# Patient Record
Sex: Female | Born: 1980 | Race: Black or African American | Hispanic: No | Marital: Married | Smoking: Never smoker
Health system: Southern US, Community
[De-identification: ages and names within clinical notes are randomized; demographics above are authoritative.]

## PROBLEM LIST (undated history)

## (undated) DIAGNOSIS — K219 Gastro-esophageal reflux disease without esophagitis: Secondary | ICD-10-CM

## (undated) DIAGNOSIS — D649 Anemia, unspecified: Secondary | ICD-10-CM

---

## 1999-11-13 ENCOUNTER — Emergency Department (HOSPITAL_COMMUNITY): Admission: EM | Admit: 1999-11-13 | Discharge: 1999-11-13 | Payer: Self-pay | Admitting: Emergency Medicine

## 2002-06-27 ENCOUNTER — Encounter: Admission: RE | Admit: 2002-06-27 | Discharge: 2002-06-27 | Payer: Self-pay | Admitting: *Deleted

## 2002-06-27 ENCOUNTER — Other Ambulatory Visit: Admission: RE | Admit: 2002-06-27 | Discharge: 2002-06-27 | Payer: Self-pay | Admitting: *Deleted

## 2002-07-11 ENCOUNTER — Encounter: Admission: RE | Admit: 2002-07-11 | Discharge: 2002-07-11 | Payer: Self-pay | Admitting: *Deleted

## 2006-09-14 ENCOUNTER — Emergency Department (HOSPITAL_COMMUNITY): Admission: EM | Admit: 2006-09-14 | Discharge: 2006-09-14 | Payer: Self-pay | Admitting: Emergency Medicine

## 2006-10-17 ENCOUNTER — Ambulatory Visit: Payer: Self-pay | Admitting: Family Medicine

## 2007-04-03 ENCOUNTER — Ambulatory Visit (HOSPITAL_COMMUNITY): Admission: RE | Admit: 2007-04-03 | Discharge: 2007-04-03 | Payer: Self-pay | Admitting: Obstetrics & Gynecology

## 2008-02-17 ENCOUNTER — Other Ambulatory Visit: Admission: RE | Admit: 2008-02-17 | Discharge: 2008-02-17 | Payer: Self-pay | Admitting: Obstetrics and Gynecology

## 2009-02-17 ENCOUNTER — Other Ambulatory Visit: Admission: RE | Admit: 2009-02-17 | Discharge: 2009-02-17 | Payer: Self-pay | Admitting: Obstetrics and Gynecology

## 2010-01-11 ENCOUNTER — Encounter: Admission: RE | Admit: 2010-01-11 | Discharge: 2010-01-11 | Payer: Self-pay | Admitting: Obstetrics and Gynecology

## 2010-03-01 ENCOUNTER — Encounter (INDEPENDENT_AMBULATORY_CARE_PROVIDER_SITE_OTHER): Payer: Self-pay | Admitting: Obstetrics and Gynecology

## 2010-03-01 ENCOUNTER — Inpatient Hospital Stay (HOSPITAL_COMMUNITY): Admission: AD | Admit: 2010-03-01 | Discharge: 2010-03-05 | Payer: Self-pay | Admitting: Obstetrics and Gynecology

## 2010-10-02 HISTORY — PX: CHOLECYSTECTOMY: SHX55

## 2010-12-13 ENCOUNTER — Other Ambulatory Visit: Payer: Self-pay | Admitting: Gastroenterology

## 2010-12-13 DIAGNOSIS — R1013 Epigastric pain: Secondary | ICD-10-CM

## 2010-12-15 ENCOUNTER — Ambulatory Visit
Admission: RE | Admit: 2010-12-15 | Discharge: 2010-12-15 | Disposition: A | Discharge status: Home / Self Care | Payer: 59 | Source: Ambulatory Visit | Attending: Gastroenterology | Admitting: Gastroenterology

## 2010-12-15 DIAGNOSIS — R1013 Epigastric pain: Secondary | ICD-10-CM

## 2010-12-16 LAB — HIV ANTIBODY (ROUTINE TESTING W REFLEX): HIV: NONREACTIVE

## 2010-12-16 LAB — ABO/RH

## 2010-12-19 LAB — CBC
HCT: 29.7 % — ABNORMAL LOW (ref 36.0–46.0)
HCT: 35.2 % — ABNORMAL LOW (ref 36.0–46.0)
Hemoglobin: 10.3 g/dL — ABNORMAL LOW (ref 12.0–15.0)
Hemoglobin: 12.2 g/dL (ref 12.0–15.0)
MCHC: 34.8 g/dL (ref 30.0–36.0)
MCHC: 34.6 g/dL (ref 30.0–36.0)
Platelets: 163 10*3/uL (ref 150–400)
RBC: 3.4 MIL/uL — ABNORMAL LOW (ref 3.87–5.11)
RDW: 13.5 % (ref 11.5–15.5)
RDW: 13.5 % (ref 11.5–15.5)
WBC: 14.2 10*3/uL — ABNORMAL HIGH (ref 4.0–10.5)

## 2010-12-27 ENCOUNTER — Other Ambulatory Visit: Payer: Self-pay | Admitting: Obstetrics and Gynecology

## 2010-12-27 ENCOUNTER — Other Ambulatory Visit (HOSPITAL_COMMUNITY)
Admission: RE | Admit: 2010-12-27 | Discharge: 2010-12-27 | Disposition: A | Discharge status: Home / Self Care | Payer: 59 | Source: Ambulatory Visit | Attending: Obstetrics and Gynecology | Admitting: Obstetrics and Gynecology

## 2010-12-27 DIAGNOSIS — Z01419 Encounter for gynecological examination (general) (routine) without abnormal findings: Secondary | ICD-10-CM | POA: Insufficient documentation

## 2011-01-26 ENCOUNTER — Encounter (HOSPITAL_COMMUNITY): Payer: 59

## 2011-01-26 ENCOUNTER — Other Ambulatory Visit: Payer: Self-pay | Admitting: Surgery

## 2011-01-26 LAB — CBC
Hemoglobin: 11.6 g/dL — ABNORMAL LOW (ref 12.0–15.0)
MCH: 29.2 pg (ref 26.0–34.0)
MCV: 86.1 fL (ref 78.0–100.0)
Platelets: 249 10*3/uL (ref 150–400)
RBC: 3.97 MIL/uL (ref 3.87–5.11)
RDW: 12.9 % (ref 11.5–15.5)
WBC: 8.5 10*3/uL (ref 4.0–10.5)

## 2011-01-26 LAB — SURGICAL PCR SCREEN: Staphylococcus aureus: NEGATIVE

## 2011-02-01 ENCOUNTER — Ambulatory Visit (HOSPITAL_COMMUNITY)
Admission: RE | Admit: 2011-02-01 | Discharge: 2011-02-01 | Disposition: A | Discharge status: Home / Self Care | Payer: 59 | Source: Ambulatory Visit | Attending: Surgery | Admitting: Surgery

## 2011-02-01 ENCOUNTER — Other Ambulatory Visit: Payer: Self-pay | Admitting: Surgery

## 2011-02-01 DIAGNOSIS — Z79899 Other long term (current) drug therapy: Secondary | ICD-10-CM | POA: Insufficient documentation

## 2011-02-01 DIAGNOSIS — K8010 Calculus of gallbladder with chronic cholecystitis without obstruction: Secondary | ICD-10-CM | POA: Insufficient documentation

## 2011-02-01 DIAGNOSIS — O9989 Other specified diseases and conditions complicating pregnancy, childbirth and the puerperium: Secondary | ICD-10-CM | POA: Insufficient documentation

## 2011-02-01 DIAGNOSIS — Z01812 Encounter for preprocedural laboratory examination: Secondary | ICD-10-CM | POA: Insufficient documentation

## 2011-02-01 DIAGNOSIS — R1013 Epigastric pain: Secondary | ICD-10-CM | POA: Insufficient documentation

## 2011-02-01 DIAGNOSIS — R110 Nausea: Secondary | ICD-10-CM | POA: Insufficient documentation

## 2011-02-01 DIAGNOSIS — Z01818 Encounter for other preprocedural examination: Secondary | ICD-10-CM | POA: Insufficient documentation

## 2011-02-01 DIAGNOSIS — R1011 Right upper quadrant pain: Secondary | ICD-10-CM | POA: Insufficient documentation

## 2011-02-01 NOTE — Op Note (Signed)
Mary Morton, LAATSCH            ACCOUNT NO.:  [id number]  MEDICAL RECORD NO.:  [id number]           PATIENT TYPE:  O  LOCATION:  DAYL                         FACILITY:  [facility]  PHYSICIAN:  Abigail Miyamoto, M.D. DATE OF BIRTH:  [birth year]  DATE OF PROCEDURE:  02/01/2011 DATE OF DISCHARGE:                              OPERATIVE REPORT   PREOPERATIVE DIAGNOSIS:  Symptomatic cholelithiasis.  POSTOPERATIVE DIAGNOSIS:  Symptomatic cholelithiasis.  PROCEDURE:  Laparoscopic cholecystectomy.  SURGEON:  Abigail Miyamoto, M.D.  ANESTHESIA:  General and 0.5% Marcaine.  ESTIMATED BLOOD LOSS:  Minimal.  INDICATIONS:  The is a 30 year old female who is in her second trimester pregnancy.  She has had significant attacks of symptomatic cholelithiasis both before and during her pregnancy.  Decision was made to proceed with cholecystectomy.  FINDINGS:  The patient was found to have a very thick-walled gallbladder with large amount of adhesions to the gallbladder consistent with chronic cholecystitis.  PROCEDURE IN DETAIL:  The patient was brought to the operative room, identified as [employer]  She was placed on the operating room table and general anesthesia was induced.  Her abdomen was then prepped and draped in usual sterile fashion.  Using a #15 blade, a small vertical incision was made above the umbilicus.  This was carried down to the fascia which was then opened with scalpel.  A hemostat was then used to pass through the peritoneal cavity under direct vision.  Next, 0 Vicryl suture pursestring suture was placed around the opening.  The [facility] port was placed through the opening and insufflation of the abdomen was begun.  I could easily visualize the gallbladder which was thick-walled in appearance.  I was able to also visualize the gravid uterus and the pelvis.  I placed a 5-mm port in the patient's epigastrium and 2 more in the right upper quadrant all under  direct vision.  The gallbladder was then grasped and retracted above the liver bed.  A large amount of adhesions of omentum to the gallbladder were then taken down both bluntly and with the electrocautery.  I was then able to identify the base of the gallbladder.  The cystic duct and cystic artery were dissected out and a critical window was achieved around both.  I clipped the cystic duct 3 times proximally, once distally and transected it.  I then clipped the cystic artery 3 times proximally, once distally and transected it as well.  I then slowly dissected free the gallbladder from the liver bed with the cautery. Once it was free from liver bed, hemostasis was achieved with cautery. I then placed the gallbladder in an endosac and removed it through the incision at the umbilicus.  I then again evaluated the abdomen, again hemostasis appeared to be achieved.  The 0 Vicryl at the umbilicus was tied in place closing the fascial defect.  All ports were removed under direct vision and the abdomen was deflated.  All incisions were anesthetized with Marcaine and closed with 4-0 Monocryl subcuticular sutures.  Steri- Strips and Band-Aids were then applied.  The patient tolerated the procedure well.  All counts were correct at the end of  procedure.  The patient was then extubated in the operating room and taken in stable condition to recovery room.     Abigail Miyamoto, M.D.     DB/MEDQ  D:  02/01/2011  T:  02/01/2011  Job:  [id number]  Electronically Signed by Abigail Miyamoto M.D. on 02/01/2011 01:56:35 PM

## 2011-02-22 ENCOUNTER — Other Ambulatory Visit (HOSPITAL_COMMUNITY): Payer: Self-pay | Admitting: Obstetrics and Gynecology

## 2011-03-01 ENCOUNTER — Ambulatory Visit (HOSPITAL_COMMUNITY)
Admission: RE | Admit: 2011-03-01 | Discharge: 2011-03-01 | Disposition: A | Discharge status: Home / Self Care | Payer: 59 | Source: Ambulatory Visit | Attending: Obstetrics and Gynecology | Admitting: Obstetrics and Gynecology

## 2011-03-01 ENCOUNTER — Other Ambulatory Visit (HOSPITAL_COMMUNITY): Payer: Self-pay | Admitting: Obstetrics and Gynecology

## 2011-03-01 DIAGNOSIS — O34219 Maternal care for unspecified type scar from previous cesarean delivery: Secondary | ICD-10-CM

## 2011-03-01 DIAGNOSIS — IMO0001 Reserved for inherently not codable concepts without codable children: Secondary | ICD-10-CM | POA: Insufficient documentation

## 2011-03-01 DIAGNOSIS — Z363 Encounter for antenatal screening for malformations: Secondary | ICD-10-CM | POA: Insufficient documentation

## 2011-03-01 DIAGNOSIS — O358XX0 Maternal care for other (suspected) fetal abnormality and damage, not applicable or unspecified: Secondary | ICD-10-CM | POA: Insufficient documentation

## 2011-03-01 DIAGNOSIS — Z1389 Encounter for screening for other disorder: Secondary | ICD-10-CM | POA: Insufficient documentation

## 2011-04-12 ENCOUNTER — Encounter (HOSPITAL_COMMUNITY): Payer: Self-pay

## 2011-04-12 ENCOUNTER — Other Ambulatory Visit (HOSPITAL_COMMUNITY): Payer: Self-pay | Admitting: Obstetrics and Gynecology

## 2011-04-12 ENCOUNTER — Ambulatory Visit (HOSPITAL_COMMUNITY)
Admission: RE | Admit: 2011-04-12 | Discharge: 2011-04-12 | Disposition: A | Discharge status: Home / Self Care | Payer: 59 | Source: Ambulatory Visit | Attending: Obstetrics and Gynecology | Admitting: Obstetrics and Gynecology

## 2011-04-12 DIAGNOSIS — Q270 Congenital absence and hypoplasia of umbilical artery: Secondary | ICD-10-CM

## 2011-04-12 DIAGNOSIS — IMO0001 Reserved for inherently not codable concepts without codable children: Secondary | ICD-10-CM | POA: Insufficient documentation

## 2011-04-12 DIAGNOSIS — O34219 Maternal care for unspecified type scar from previous cesarean delivery: Secondary | ICD-10-CM | POA: Insufficient documentation

## 2011-04-12 NOTE — Progress Notes (Signed)
Report in AS/EPIC; follow-up as needed 

## 2011-05-24 ENCOUNTER — Ambulatory Visit (HOSPITAL_COMMUNITY)
Admission: RE | Admit: 2011-05-24 | Discharge: 2011-05-24 | Disposition: A | Discharge status: Home / Self Care | Payer: 59 | Source: Ambulatory Visit | Attending: Obstetrics and Gynecology | Admitting: Obstetrics and Gynecology

## 2011-05-24 ENCOUNTER — Other Ambulatory Visit (HOSPITAL_COMMUNITY): Payer: Self-pay | Admitting: Obstetrics and Gynecology

## 2011-05-24 DIAGNOSIS — Q270 Congenital absence and hypoplasia of umbilical artery: Secondary | ICD-10-CM

## 2011-05-24 DIAGNOSIS — IMO0001 Reserved for inherently not codable concepts without codable children: Secondary | ICD-10-CM | POA: Insufficient documentation

## 2011-05-24 DIAGNOSIS — O34219 Maternal care for unspecified type scar from previous cesarean delivery: Secondary | ICD-10-CM | POA: Insufficient documentation

## 2011-05-24 NOTE — Progress Notes (Signed)
Patient seen for ultrasound only appointment today.  Please see AS-OBGYN report for details.  

## 2011-06-21 ENCOUNTER — Ambulatory Visit (HOSPITAL_COMMUNITY)
Admission: RE | Admit: 2011-06-21 | Discharge: 2011-06-21 | Disposition: A | Discharge status: Home / Self Care | Payer: 59 | Source: Ambulatory Visit | Attending: Obstetrics and Gynecology | Admitting: Obstetrics and Gynecology

## 2011-06-21 DIAGNOSIS — IMO0001 Reserved for inherently not codable concepts without codable children: Secondary | ICD-10-CM | POA: Insufficient documentation

## 2011-06-21 DIAGNOSIS — Q270 Congenital absence and hypoplasia of umbilical artery: Secondary | ICD-10-CM

## 2011-06-21 DIAGNOSIS — O34219 Maternal care for unspecified type scar from previous cesarean delivery: Secondary | ICD-10-CM | POA: Insufficient documentation

## 2011-07-03 NOTE — Patient Instructions (Addendum)
   Your procedure is scheduled on: Monday October 8th  Enter through the Main Entrance of [facility] at: 7:30am Pick up the phone at the desk and dial [phone] and inform us of your arrival  Please call this number if you have any problems the morning of surgery: [phone]  Remember: Do not eat food after midnight  Do not drink clear liquids after:midnight Take these medicines the morning of surgery with a SIP OF WATER: pepcid  Do not wear jewelry, make-up, or FINGER nail polish Do not wear lotions, powders, or perfumes.  You may wear deodorant. Do not shave 48 hours prior to surgery. Do not bring valuables to the hospital. Leave suitcase in the car. After Surgery it may be brought to your room. For patients being admitted to the hospital, checkout time is 11:00am the day of discharge.   Remember to use your hibiclens as instructed.Please shower with 1/2 bottle the evening before your surgery and the other 1/2 bottle the morning of surgery.

## 2011-07-05 ENCOUNTER — Encounter (HOSPITAL_COMMUNITY): Payer: Self-pay

## 2011-07-05 ENCOUNTER — Encounter (HOSPITAL_COMMUNITY)
Admission: RE | Admit: 2011-07-05 | Discharge: 2011-07-05 | Disposition: A | Discharge status: Home / Self Care | Payer: 59 | Source: Ambulatory Visit | Attending: Obstetrics and Gynecology | Admitting: Obstetrics and Gynecology

## 2011-07-05 HISTORY — DX: Anemia, unspecified: D64.9

## 2011-07-05 HISTORY — DX: Gastro-esophageal reflux disease without esophagitis: K21.9

## 2011-07-05 LAB — CBC
HCT: 33.1 % — ABNORMAL LOW (ref 36.0–46.0)
Hemoglobin: 11.1 g/dL — ABNORMAL LOW (ref 12.0–15.0)
MCH: 29.4 pg (ref 26.0–34.0)
MCHC: 33.5 g/dL (ref 30.0–36.0)
MCV: 87.6 fL (ref 78.0–100.0)
RBC: 3.78 MIL/uL — ABNORMAL LOW (ref 3.87–5.11)

## 2011-07-07 ENCOUNTER — Other Ambulatory Visit: Payer: Self-pay | Admitting: Obstetrics and Gynecology

## 2011-07-10 ENCOUNTER — Encounter (HOSPITAL_COMMUNITY): Payer: Self-pay | Admitting: Anesthesiology

## 2011-07-10 ENCOUNTER — Other Ambulatory Visit: Payer: Self-pay | Admitting: Obstetrics and Gynecology

## 2011-07-10 ENCOUNTER — Encounter (HOSPITAL_COMMUNITY): Payer: Self-pay

## 2011-07-10 ENCOUNTER — Encounter (HOSPITAL_COMMUNITY)
Admission: RE | Disposition: A | Discharge status: Home / Self Care | Payer: Self-pay | Source: Ambulatory Visit | Attending: Obstetrics and Gynecology

## 2011-07-10 ENCOUNTER — Inpatient Hospital Stay (HOSPITAL_COMMUNITY): Payer: 59 | Admitting: Anesthesiology

## 2011-07-10 ENCOUNTER — Inpatient Hospital Stay (HOSPITAL_COMMUNITY)
Admission: RE | Admit: 2011-07-10 | Discharge: 2011-07-13 | DRG: 766 | Disposition: A | Discharge status: Home / Self Care | Payer: 59 | Source: Ambulatory Visit | Attending: Obstetrics and Gynecology | Admitting: Obstetrics and Gynecology

## 2011-07-10 ENCOUNTER — Encounter (HOSPITAL_COMMUNITY): Payer: Self-pay | Admitting: *Deleted

## 2011-07-10 DIAGNOSIS — R001 Bradycardia, unspecified: Secondary | ICD-10-CM | POA: Diagnosis not present

## 2011-07-10 DIAGNOSIS — I10 Essential (primary) hypertension: Secondary | ICD-10-CM | POA: Diagnosis not present

## 2011-07-10 DIAGNOSIS — Z98891 History of uterine scar from previous surgery: Secondary | ICD-10-CM

## 2011-07-10 DIAGNOSIS — O34219 Maternal care for unspecified type scar from previous cesarean delivery: Principal | ICD-10-CM | POA: Diagnosis present

## 2011-07-10 DIAGNOSIS — Z01812 Encounter for preprocedural laboratory examination: Secondary | ICD-10-CM

## 2011-07-10 DIAGNOSIS — Z01818 Encounter for other preprocedural examination: Secondary | ICD-10-CM

## 2011-07-10 LAB — TYPE AND SCREEN
ABO/RH(D): A POS
Antibody Screen: NEGATIVE

## 2011-07-10 LAB — CBC
MCHC: 33.7 g/dL (ref 30.0–36.0)
Platelets: 190 10*3/uL (ref 150–400)
RDW: 13 % (ref 11.5–15.5)
WBC: 15.2 10*3/uL — ABNORMAL HIGH (ref 4.0–10.5)

## 2011-07-10 LAB — COMPREHENSIVE METABOLIC PANEL
AST: 15 U/L (ref 0–37)
Albumin: 2.4 g/dL — ABNORMAL LOW (ref 3.5–5.2)
Alkaline Phosphatase: 76 U/L (ref 39–117)
Chloride: 101 mEq/L (ref 96–112)
Potassium: 3.9 mEq/L (ref 3.5–5.1)
Total Bilirubin: 0.6 mg/dL (ref 0.3–1.2)
Total Protein: 5.9 g/dL — ABNORMAL LOW (ref 6.0–8.3)

## 2011-07-10 LAB — URINALYSIS, DIPSTICK ONLY
Glucose, UA: NEGATIVE mg/dL
Ketones, ur: 40 mg/dL — AB
Leukocytes, UA: NEGATIVE
Protein, ur: NEGATIVE mg/dL
pH: 7 (ref 5.0–8.0)

## 2011-07-10 LAB — LACTATE DEHYDROGENASE: LDH: 223 U/L (ref 94–250)

## 2011-07-10 LAB — ABO/RH: ABO/RH(D): A POS

## 2011-07-10 SURGERY — Surgical Case
Anesthesia: Regional

## 2011-07-10 MED ORDER — WITCH HAZEL-GLYCERIN EX PADS: 1.0000 "application " | MEDICATED_PAD | CUTANEOUS | Status: DC | PRN

## 2011-07-10 MED ORDER — KETOROLAC TROMETHAMINE 60 MG/2ML IM SOLN
60.0000 mg | Freq: Once | INTRAMUSCULAR | Status: AC | PRN
  Administered 2011-07-10: 60 mg via INTRAMUSCULAR

## 2011-07-10 MED ORDER — CEFAZOLIN SODIUM 1-5 GM-% IV SOLN
INTRAVENOUS | Status: AC
  Filled 2011-07-10: qty 50

## 2011-07-10 MED ORDER — DIPHENHYDRAMINE HCL 25 MG PO CAPS: 25.0000 mg | ORAL_CAPSULE | Freq: Four times a day (QID) | ORAL | Status: DC | PRN

## 2011-07-10 MED ORDER — LACTATED RINGERS IV SOLN
INTRAVENOUS | Status: DC
  Administered 2011-07-10 (×3): via INTRAVENOUS

## 2011-07-10 MED ORDER — ZOLPIDEM TARTRATE 5 MG PO TABS: 5.0000 mg | ORAL_TABLET | Freq: Every evening | ORAL | Status: DC | PRN

## 2011-07-10 MED ORDER — ONDANSETRON HCL 4 MG PO TABS: 4.0000 mg | ORAL_TABLET | ORAL | Status: DC | PRN

## 2011-07-10 MED ORDER — KETOROLAC TROMETHAMINE 30 MG/ML IJ SOLN: 30.0000 mg | Freq: Four times a day (QID) | INTRAMUSCULAR | Status: AC | PRN

## 2011-07-10 MED ORDER — NALOXONE HCL 0.4 MG/ML IJ SOLN: 0.4000 mg | INTRAMUSCULAR | Status: DC | PRN

## 2011-07-10 MED ORDER — FENTANYL CITRATE 0.05 MG/ML IJ SOLN
INTRAMUSCULAR | Status: DC | PRN
  Administered 2011-07-10: 80 ug via INTRAVENOUS

## 2011-07-10 MED ORDER — SODIUM CHLORIDE 0.9 % IV SOLN
1.0000 ug/kg/h | INTRAVENOUS | Status: DC | PRN
  Filled 2011-07-10: qty 2.5

## 2011-07-10 MED ORDER — OXYTOCIN 20 UNITS IN LACTATED RINGERS INFUSION - SIMPLE
INTRAVENOUS | Status: AC
  Administered 2011-07-10: 20 [IU]
  Filled 2011-07-10: qty 1000

## 2011-07-10 MED ORDER — ONDANSETRON HCL 4 MG/2ML IJ SOLN: 4.0000 mg | INTRAMUSCULAR | Status: DC | PRN

## 2011-07-10 MED ORDER — MORPHINE SULFATE (PF) 0.5 MG/ML IJ SOLN
INTRAMUSCULAR | Status: DC | PRN
  Administered 2011-07-10: .15 mg via INTRATHECAL

## 2011-07-10 MED ORDER — NALBUPHINE HCL 10 MG/ML IJ SOLN
5.0000 mg | INTRAMUSCULAR | Status: DC | PRN
  Filled 2011-07-10: qty 1

## 2011-07-10 MED ORDER — DIBUCAINE 1 % RE OINT: 1.0000 "application " | TOPICAL_OINTMENT | RECTAL | Status: DC | PRN

## 2011-07-10 MED ORDER — FAMOTIDINE 20 MG PO TABS: 20.0000 mg | ORAL_TABLET | Freq: Every evening | ORAL | Status: DC | PRN

## 2011-07-10 MED ORDER — ONDANSETRON HCL 4 MG/2ML IJ SOLN
INTRAMUSCULAR | Status: DC | PRN
  Administered 2011-07-10: 4 mg via INTRAVENOUS

## 2011-07-10 MED ORDER — METHYLERGONOVINE MALEATE 0.2 MG PO TABS: 0.2000 mg | ORAL_TABLET | ORAL | Status: DC | PRN

## 2011-07-10 MED ORDER — DEXAMETHASONE SODIUM PHOSPHATE 10 MG/ML IJ SOLN
INTRAMUSCULAR | Status: DC | PRN
  Administered 2011-07-10: 10 mg via INTRAVENOUS

## 2011-07-10 MED ORDER — LACTATED RINGERS IV SOLN
Freq: Once | INTRAVENOUS | Status: AC
  Administered 2011-07-10: 1000 mL via INTRAVENOUS

## 2011-07-10 MED ORDER — OXYCODONE-ACETAMINOPHEN 5-325 MG PO TABS
1.0000 | ORAL_TABLET | ORAL | Status: DC | PRN
  Administered 2011-07-13: 1 via ORAL
  Filled 2011-07-10: qty 1

## 2011-07-10 MED ORDER — DIPHENHYDRAMINE HCL 25 MG PO CAPS: 25.0000 mg | ORAL_CAPSULE | ORAL | Status: DC | PRN

## 2011-07-10 MED ORDER — TRIAMCINOLONE ACETONIDE 40 MG/ML IJ SUSP: 40.0000 mg | Freq: Once | INTRAMUSCULAR | Status: DC

## 2011-07-10 MED ORDER — SODIUM CHLORIDE 0.9 % IJ SOLN: 3.0000 mL | INTRAMUSCULAR | Status: DC | PRN

## 2011-07-10 MED ORDER — DEXAMETHASONE SODIUM PHOSPHATE 10 MG/ML IJ SOLN
INTRAMUSCULAR | Status: AC
  Filled 2011-07-10: qty 1

## 2011-07-10 MED ORDER — METHYLERGONOVINE MALEATE 0.2 MG/ML IJ SOLN: 0.2000 mg | INTRAMUSCULAR | Status: DC | PRN

## 2011-07-10 MED ORDER — MORPHINE SULFATE (PF) 0.5 MG/ML IJ SOLN
INTRAMUSCULAR | Status: DC | PRN
  Administered 2011-07-10: 4.85 mg via INTRAVENOUS

## 2011-07-10 MED ORDER — ONDANSETRON HCL 4 MG/2ML IJ SOLN
INTRAMUSCULAR | Status: AC
  Filled 2011-07-10: qty 4

## 2011-07-10 MED ORDER — MORPHINE SULFATE 0.5 MG/ML IJ SOLN
INTRAMUSCULAR | Status: AC
  Filled 2011-07-10: qty 20

## 2011-07-10 MED ORDER — FENTANYL CITRATE 0.05 MG/ML IJ SOLN
INTRAMUSCULAR | Status: DC | PRN
  Administered 2011-07-10: 20 ug via INTRATHECAL

## 2011-07-10 MED ORDER — TRIAMCINOLONE ACETONIDE 40 MG/ML IJ SUSP
INTRAMUSCULAR | Status: DC | PRN
  Administered 2011-07-10: 40 mg

## 2011-07-10 MED ORDER — TRIAMCINOLONE ACETONIDE 40 MG/ML IJ SUSP
40.0000 mg | Freq: Once | INTRAMUSCULAR | Status: DC
  Filled 2011-07-10: qty 1

## 2011-07-10 MED ORDER — TETANUS-DIPHTH-ACELL PERTUSSIS 5-2.5-18.5 LF-MCG/0.5 IM SUSP: 0.5000 mL | Freq: Once | INTRAMUSCULAR | Status: DC

## 2011-07-10 MED ORDER — OXYTOCIN 10 UNIT/ML IJ SOLN
INTRAMUSCULAR | Status: AC
  Filled 2011-07-10: qty 8

## 2011-07-10 MED ORDER — CEFAZOLIN SODIUM 1-5 GM-% IV SOLN
1.0000 g | INTRAVENOUS | Status: AC
  Administered 2011-07-10: 1 g via INTRAVENOUS

## 2011-07-10 MED ORDER — LANOLIN HYDROUS EX OINT: 1.0000 "application " | TOPICAL_OINTMENT | CUTANEOUS | Status: DC | PRN

## 2011-07-10 MED ORDER — IBUPROFEN 600 MG PO TABS
600.0000 mg | ORAL_TABLET | Freq: Four times a day (QID) | ORAL | Status: DC | PRN
  Filled 2011-07-10 (×4): qty 1

## 2011-07-10 MED ORDER — SIMETHICONE 80 MG PO CHEW
80.0000 mg | CHEWABLE_TABLET | Freq: Three times a day (TID) | ORAL | Status: DC
  Administered 2011-07-10 – 2011-07-13 (×11): 80 mg via ORAL

## 2011-07-10 MED ORDER — IBUPROFEN 600 MG PO TABS
600.0000 mg | ORAL_TABLET | Freq: Four times a day (QID) | ORAL | Status: DC
  Administered 2011-07-10 – 2011-07-12 (×9): 600 mg via ORAL
  Filled 2011-07-10 (×5): qty 1

## 2011-07-10 MED ORDER — ONDANSETRON HCL 4 MG/2ML IJ SOLN: 4.0000 mg | Freq: Three times a day (TID) | INTRAMUSCULAR | Status: DC | PRN

## 2011-07-10 MED ORDER — SCOPOLAMINE 1 MG/3DAYS TD PT72
MEDICATED_PATCH | TRANSDERMAL | Status: AC
  Administered 2011-07-10: 1.5 mg
  Filled 2011-07-10: qty 1

## 2011-07-10 MED ORDER — BUPIVACAINE IN DEXTROSE 0.75-8.25 % IT SOLN
INTRATHECAL | Status: DC | PRN
  Administered 2011-07-10: 12 mg via INTRATHECAL

## 2011-07-10 MED ORDER — DIPHENHYDRAMINE HCL 50 MG/ML IJ SOLN
12.5000 mg | INTRAMUSCULAR | Status: DC | PRN
  Administered 2011-07-10: 12.5 mg via INTRAVENOUS
  Filled 2011-07-10: qty 1

## 2011-07-10 MED ORDER — DIPHENHYDRAMINE HCL 50 MG/ML IJ SOLN: 25.0000 mg | INTRAMUSCULAR | Status: DC | PRN

## 2011-07-10 MED ORDER — PRENATAL PLUS 27-1 MG PO TABS
1.0000 | ORAL_TABLET | Freq: Every day | ORAL | Status: DC
  Administered 2011-07-11 – 2011-07-13 (×3): 1 via ORAL
  Filled 2011-07-10 (×3): qty 1

## 2011-07-10 MED ORDER — OXYTOCIN 20 UNITS IN LACTATED RINGERS INFUSION - SIMPLE: 125.0000 mL/h | INTRAVENOUS | Status: AC

## 2011-07-10 MED ORDER — LACTATED RINGERS IV SOLN
INTRAVENOUS | Status: DC
  Administered 2011-07-10 – 2011-07-11 (×2): via INTRAVENOUS

## 2011-07-10 MED ORDER — METOCLOPRAMIDE HCL 5 MG/ML IJ SOLN
10.0000 mg | Freq: Three times a day (TID) | INTRAMUSCULAR | Status: DC | PRN
  Administered 2011-07-10: 10 mg via INTRAVENOUS

## 2011-07-10 MED ORDER — METOCLOPRAMIDE HCL 5 MG/ML IJ SOLN
INTRAMUSCULAR | Status: AC
  Filled 2011-07-10: qty 2

## 2011-07-10 MED ORDER — OXYTOCIN 20 UNITS IN LACTATED RINGERS INFUSION - SIMPLE
INTRAVENOUS | Status: DC | PRN
  Administered 2011-07-10: 20 [IU] via INTRAVENOUS

## 2011-07-10 MED ORDER — FERROUS SULFATE 325 (65 FE) MG PO TABS
325.0000 mg | ORAL_TABLET | Freq: Two times a day (BID) | ORAL | Status: DC
  Administered 2011-07-10 – 2011-07-13 (×5): 325 mg via ORAL
  Filled 2011-07-10 (×5): qty 1

## 2011-07-10 MED ORDER — SCOPOLAMINE 1 MG/3DAYS TD PT72
1.0000 | MEDICATED_PATCH | Freq: Once | TRANSDERMAL | Status: DC
  Filled 2011-07-10: qty 1

## 2011-07-10 MED ORDER — SENNOSIDES-DOCUSATE SODIUM 8.6-50 MG PO TABS
2.0000 | ORAL_TABLET | Freq: Every day | ORAL | Status: DC
  Administered 2011-07-10 – 2011-07-12 (×3): 2 via ORAL

## 2011-07-10 MED ORDER — MENTHOL 3 MG MT LOZG: 1.0000 | LOZENGE | OROMUCOSAL | Status: DC | PRN

## 2011-07-10 MED ORDER — KETOROLAC TROMETHAMINE 60 MG/2ML IM SOLN
INTRAMUSCULAR | Status: AC
  Administered 2011-07-10: 60 mg via INTRAMUSCULAR
  Filled 2011-07-10: qty 2

## 2011-07-10 MED ORDER — FENTANYL CITRATE 0.05 MG/ML IJ SOLN
INTRAMUSCULAR | Status: AC
  Filled 2011-07-10: qty 4

## 2011-07-10 MED ORDER — SIMETHICONE 80 MG PO CHEW: 80.0000 mg | CHEWABLE_TABLET | ORAL | Status: DC | PRN

## 2011-07-10 SURGICAL SUPPLY — 35 items
BENZOIN TINCTURE PRP APPL 2/3 (GAUZE/BANDAGES/DRESSINGS) ×2 IMPLANT
CHLORAPREP W/TINT 26ML (MISCELLANEOUS) ×2 IMPLANT
CLOTH BEACON ORANGE TIMEOUT ST (SAFETY) ×2 IMPLANT
DERMABOND ADVANCED (GAUZE/BANDAGES/DRESSINGS)
DERMABOND ADVANCED .7 DNX12 (GAUZE/BANDAGES/DRESSINGS) IMPLANT
DRESSING TELFA 8X3 (GAUZE/BANDAGES/DRESSINGS) ×2 IMPLANT
DRSG PAD ABDOMINAL 8X10 ST (GAUZE/BANDAGES/DRESSINGS) ×2 IMPLANT
ELECT REM PT RETURN 9FT ADLT (ELECTROSURGICAL) ×2
ELECTRODE REM PT RTRN 9FT ADLT (ELECTROSURGICAL) ×1 IMPLANT
EXTRACTOR VACUUM BELL STYLE (SUCTIONS) IMPLANT
GAUZE SPONGE 4X4 12PLY STRL LF (GAUZE/BANDAGES/DRESSINGS) IMPLANT
GLOVE BIO SURGEON STRL SZ 6.5 (GLOVE) ×2 IMPLANT
GLOVE BIOGEL PI IND STRL 7.0 (GLOVE) ×1 IMPLANT
GLOVE BIOGEL PI INDICATOR 7.0 (GLOVE) ×1
GOWN PREVENTION PLUS LG XLONG (DISPOSABLE) ×4 IMPLANT
GOWN PREVENTION PLUS XLARGE (GOWN DISPOSABLE) IMPLANT
KIT ABG SYR 3ML LUER SLIP (SYRINGE) IMPLANT
NEEDLE HYPO 25X5/8 SAFETYGLIDE (NEEDLE) IMPLANT
NS IRRIG 1000ML POUR BTL (IV SOLUTION) ×2 IMPLANT
PACK C SECTION WH (CUSTOM PROCEDURE TRAY) ×2 IMPLANT
PAD ABD 7.5X8 STRL (GAUZE/BANDAGES/DRESSINGS) IMPLANT
RTRCTR C-SECT PINK 25CM LRG (MISCELLANEOUS) IMPLANT
SLEEVE SCD COMPRESS KNEE MED (MISCELLANEOUS) IMPLANT
SPONGE GAUZE 4X4 12PLY (GAUZE/BANDAGES/DRESSINGS) ×2 IMPLANT
STRIP CLOSURE SKIN 1/2X4 (GAUZE/BANDAGES/DRESSINGS) ×2 IMPLANT
SUT CHROMIC 0 CTX 36 (SUTURE) ×12 IMPLANT
SUT PLAIN 2 0 (SUTURE)
SUT PLAIN 2 0 XLH (SUTURE) ×4 IMPLANT
SUT PLAIN ABS 2-0 54XMFL TIE (SUTURE) IMPLANT
SUT VIC AB 0 CT1 27 (SUTURE) ×2
SUT VIC AB 0 CT1 27XBRD ANBCTR (SUTURE) ×2 IMPLANT
SUT VIC AB 4-0 KS 27 (SUTURE) ×4 IMPLANT
TOWEL OR 17X24 6PK STRL BLUE (TOWEL DISPOSABLE) ×4 IMPLANT
TRAY FOLEY CATH 14FR (SET/KITS/TRAYS/PACK) IMPLANT
WATER STERILE IRR 1000ML POUR (IV SOLUTION) ×2 IMPLANT

## 2011-07-10 NOTE — Progress Notes (Signed)
Notified Dr. Dion Body with patient's increased blood pressure after admission to Mother Baby unit and with standing for the first time and following blood pressures.  No new orders written.  No other problems noted.  Will continue to monitor closely.  Earl Gala, Linda Hedges [place]

## 2011-07-10 NOTE — Anesthesia Postprocedure Evaluation (Signed)
Anesthesia Post Note  Patient: Mary Morton Mary Morton  Procedure(s) Performed:  CESAREAN SECTION  Anesthesia type: Spinal  Patient location: PACU  Post pain: Pain level controlled  Post assessment: Post-op Vital signs reviewed  Last Vitals:  Filed Vitals:   07/10/11 1215  BP: 148/73  Pulse: 57  Temp:   Resp:     Post vital signs: Reviewed  Level of consciousness: awake  Complications: No apparent anesthesia complications

## 2011-07-10 NOTE — Op Note (Signed)
Mary Morton, Mary Morton            ACCOUNT NO.:  [id number]  MEDICAL RECORD NO.:  [id number]  LOCATION:  9125                          FACILITY:  [facility]  PHYSICIAN:  Pieter Partridge, MD   DATE OF BIRTH:  [birth year]  DATE OF PROCEDURE:  07/10/2011 DATE OF DISCHARGE:                              OPERATIVE REPORT   PREOPERATIVE DIAGNOSES:  Pregnancy at 39+ weeks, history of previous C- section, short interval pregnancy, and keloid.  POSTOPERATIVE DIAGNOSES:  Pregnancy at 39+ weeks, history of previous C- section, short interval pregnancy, and keloid.  PROCEDURE:  Repeat C-section.  SURGEON:  Pieter Partridge, MD  ASSISTANT:  Gerald Leitz, MD and technician.  ANESTHESIA:  Spinal.  IS AND OS:  2800 of IV fluids and 100 of clear urine at the end of the case.  ESTIMATED BLOOD LOSS:  600 mL.  BLOOD ADMINISTERED:  None.  Foley catheter in place.  Local anesthesia with Marcaine 0.25% 10 mL and Kenalog 40 mg/mL.  SPECIMEN:  None.  COUNTS:  Correct x3.  The patient will be admitted as an inpatient and disposition to PACU is stable.  FINDINGS:  A very thin lower uterine segment, a viable female infant in the vertex position with clear fluid noted. Time of delivery is 9:38, Apgars 9 and 9, 2 vessel cord, and weight is pending at the time of this dictation.  PROCEDURE:  [name] was identified in the holding area.  She was then taken to the operating room and underwent spinal anesthesia without complication.  She was then placed in the dorsal supine position in a leftward tilt, and she was then placed in the dorsal supine position and leftward tilt, and prepped and draped in a normal sterile fashion. Foley catheter was placed.  The patient had a significant hypertrophic keloid scar that was grasped with the Allis clamps and dissected out with the scalpel.  The scar was discarded.  A scalpel was then used to go down to the underlying layer of the fascia.  Once the fascia was  incised, the preperitoneal fat herniated through quite quickly.  Through that opening, the muscles were tented up and the fascia was opened over the rectus muscles and the incision was then extended laterally with the curved Mayo scissors.  The peritoneum was then entered bluntly and anteriorly and cephalad, stretching of the peritoneal opening was performed.  The Bovie cautery was used to release some of the peritoneum.   Bladder blade was inserted.  Attention was turned to the lower uterine segment and the vesicouterine peritoneum was then incised with the Metzenbaum scissors and a bladder flap was developed.  Bladder blade was reinserted.  Prior to making the uterine incision, the actual amniotic fluid was visualized through the lower uterine segment.  It was very thin.  It was incised with the scalpel and extended bluntly.  Head was easily delivered.  Nose and mouth were suctioned.  No nuchal cord noted. Shoulders delivered easily.  Cord was clamped x2 and cut.  Baby was passed off to the awaiting NICU team.  Placenta was removed manually.  Uterus was cleared of all clots and debris with a moist laparotomy sponge.  Uterus was exteriorized  and wrapped with a moist laparotomy sponge.  The incision was then reapproximated with 0 chromic in a running locked fashion.  The bladder was adhesed to the lower uterine segment.  Prior to the second imbrication stitch, those adhesions were taken down and the bladder was displaced.  Bladder blade was then reinserted.  Imbrication suture was used to reinforce the incision.  Another one figure-of-eight on the left- hand side was used for hemostasis.  Irrigation was performed posterior to the uterus.  Ovaries were inspected and were normal.  Tubes were normal.  Uterus was then returned to the abdomen.  The gutters were cleared of all clots and debris with copious irrigation.  The lower uterine segment incision appeared to be hemostatic.  The rectus  muscles were then investigated for bleeding.  Bovie cautery was used for hemostasis as needed.  The fascia was then reapproximated with 0 Vicryl in a continuous running fashion with 2 sutures 3/4th and 1/4th.  The subcutaneous space was then irrigated and hemostasis was achieved with the Bovie cautery.  Two layers of suture were used due to the depth and due to the gapping.  I did mount a lot attention on the incision with the suture closure.  The skin was then reapproximated with 4-0 Monocryl on a Keith needle.  There were some portions of the scar that remained but that was reapproximated without difficulty.  Kenalog 40 mg was then diluted into 10 mL of 0.25% Marcaine and injected to the incision.  The incision was then dressed with Steri-Strips and a pressure dressing was applied.  The patient tolerated the procedure well.  All instrument and sponge counts were correct x3.  The patient was taken to the recovery room in stable condition.  Baby went to the newborn nursery in stable condition.     Pieter Partridge, MD     EBV/MEDQ  D:  07/10/2011  T:  07/10/2011  Job:  [id number]

## 2011-07-10 NOTE — Consult Note (Signed)
Called to attend elective repeat C/S after a short interpregnancy interval.  No other risk factors except for suspected 2-vessel cord with normal anatomy by U/S.  At delivery infant in vertex, manually extracted with lusty cries and MAE>  No dysmorphic features.  Given tactile stim and bulb suction.  Cords confirmed to have only two vessels.  Infant voided twice.   Shown to parents then carried to [facility]anistional Nursery in warm blanket by father. Care to assigned pediatrician.  Dagoberto Ligas MD [facility]

## 2011-07-10 NOTE — Anesthesia Postprocedure Evaluation (Signed)
  Anesthesia Post-op Note  Patient: Mary Morton Lady Gary  Procedure(s) Performed:  CESAREAN SECTION  Patient Location: PACU and Mother/Baby  Anesthesia Type: Spinal  Level of Consciousness: awake, alert , oriented and patient cooperative  Airway and Oxygen Therapy: Patient Spontanous Breathing  Post-op Pain: none  Post-op Assessment: Post-op Vital signs reviewed  Post-op Vital Signs: Reviewed and stable  Complications: No apparent anesthesia complications

## 2011-07-10 NOTE — Anesthesia Procedure Notes (Addendum)
Spinal Block  Patient location during procedure: OR Staffing Anesthesiologist: JACKSON, KYLE EDWARD Preanesthetic Checklist Completed: patient identified, site marked, surgical consent, pre-op evaluation, timeout performed, IV checked, risks and benefits discussed and monitors and equipment checked Spinal Block Patient position: sitting Prep: DuraPrep Patient monitoring: heart rate, cardiac monitor, continuous pulse ox and blood pressure Approach: midline Location: L3-4 Injection technique: single-shot Needle Needle type: Sprotte  Needle gauge: 24 G Needle length: 9 cm Assessment Sensory level: T4 Additional Notes Spinal Dosage in OR  Bupivicaine ml       1.6 PFMS04   mcg        150 Fentanyl mcg            20    

## 2011-07-10 NOTE — H&P (Signed)
No changes to H&P.

## 2011-07-10 NOTE — Anesthesia Preprocedure Evaluation (Signed)
Anesthesia Evaluation  Name, MR# and DOB Patient awake  General Assessment Comment  Reviewed: Allergy & Precautions, H&P , Patient's Chart, lab work & pertinent test results  Airway Mallampati: II TM Distance: >3 FB Neck ROM: full    Dental No notable dental hx.    Pulmonary  clear to auscultation  Pulmonary exam normal       Cardiovascular Exercise Tolerance: Good regular Normal    Neuro/Psych    GI/Hepatic GERD Medicated  Endo/Other  Morbid obesity  Renal/GU      Musculoskeletal   Abdominal   Peds  Hematology   Anesthesia Other Findings   Reproductive/Obstetrics                           Anesthesia Physical Anesthesia Plan  ASA: III  Anesthesia Plan: Spinal   Post-op Pain Management:    Induction:   Airway Management Planned:   Additional Equipment:   Intra-op Plan:   Post-operative Plan:   Informed Consent: I have reviewed the patients History and Physical, chart, labs and discussed the procedure including the risks, benefits and alternatives for the proposed anesthesia with the patient or authorized representative who has indicated his/her understanding and acceptance.   Dental Advisory Given  Plan Discussed with: CRNA  Anesthesia Plan Comments: (Lab work confirmed with CRNA in room. Platelets okay. Discussed spinal anesthetic, and patient consents to the procedure:  included risk of possible headache,backache, failed block, allergic reaction, and nerve injury. This patient was asked if she had any questions or concerns before the procedure started. )        Anesthesia Quick Evaluation

## 2011-07-10 NOTE — Brief Op Note (Signed)
07/10/2011  10:39 AM  PATIENT:  Joie Bimler  30 y.o. female  PRE-OPERATIVE DIAGNOSIS:  Pregnancy at 39+ weeks, H/o previous C-section, Short interval pregnancy, Keloid POST-OPERATIVE DIAGNOSIS:  Same  PROCEDURE:  Procedure(s): CESAREAN SECTION, Repeat  SURGEON:  Surgeon(s): [name], MD Dorien Chihuahua. Cole  PHYSICIAN ASSISTANT: Gerald Leitz, MD  ASSISTANTS: Technician   FINDINGS: Thin lower uterine segment, Vtx female, APGARS 9, 9, Wt 6 lbs, 7 oz.  ANESTHESIA:   spinal  OR FLUID I/O:  Total I/O In: 2800 [I.V.:2800] Out: 700 [Urine:100; Blood:600]  BLOOD ADMINISTERED:none  DRAINS: Urinary Catheter (Foley)   LOCAL MEDICATIONS USED:  MARCAINE 0.25% 10 CC and OTHER Kenalog 40 mg/ml  SPECIMEN:  No Specimen  DISPOSITION OF SPECIMEN:  N/A  COUNTS:  YES  TOURNIQUET:  * No tourniquets in log *  DICTATION: .Other Dictation: Dictation Number [id number]  PLAN OF CARE: Admit to inpatient   PATIENT DISPOSITION:  PACU - hemodynamically stable.   Delay start of Pharmacological VTE agent (>24hrs) due to surgical blood loss or risk of bleeding:  yes

## 2011-07-10 NOTE — Progress Notes (Signed)
Notified Dr. Richardson Dopp of [facility] labs per Dr. Ginnie Smart order.  No new orders at this time.  Earl Gala, Linda Hedges [place]

## 2011-07-10 NOTE — H&P (Signed)
Mary Morton is an 30 y.o. female G2 P1001 at 41 weeks presents for Repeat C-section.  C-section recommended due to short interval pregnancy.  Delivered May 2011.  Pregnancy complicated by 2 VC.  BP increased about 2 weeks ago.  Preeclampsia work-up negative.   Pertinent Gynecological History: Menses: regular every month without intermenstrual spotting Bleeding: normal Contraception: none DES exposure: denies Blood transfusions: none Sexually transmitted diseases: no past history Previous GYN Procedures: LEEP  Last mammogram: N/a Date: N/a Last pap: normal Date: March 2012 OB History: G2, P1001   Menstrual History: Menarche age: 47 Patient's last menstrual period was 10/04/2010.    Past Medical History  Diagnosis Date  . GERD (gastroesophageal reflux disease)     with pregnancy  . Nausea and vomiting 2012    with current pregnancy  . Anemia     Past Surgical History  Procedure Date  . Cholecystectomy 2012  . Cesarean section 2011    No family history on file.  Social History:  reports that she has never smoked. She does not have any smokeless tobacco history on file. She reports that she does not drink alcohol or use illicit drugs.  Allergies:  Allergies  Allergen Reactions  . Latex Hives     (Not in a hospital admission)  Review of Systems  Constitutional: Negative for fever and chills.  Eyes: Negative for blurred vision.  Respiratory: Negative for cough and shortness of breath.   Cardiovascular: Negative for chest pain.  Gastrointestinal: Positive for abdominal pain.       Occasional contractions.  Skin: Negative for rash.  Neurological: Negative for dizziness and headaches.    Last menstrual period 10/04/2010, unknown if currently breastfeeding. Physical Exam  Constitutional: She is oriented to person, place, and time. She appears well-developed and well-nourished.  HENT:  Head: Normocephalic and atraumatic.  Eyes: EOM are normal.  Neck: Normal  range of motion.  Cardiovascular: Normal rate and regular rhythm.   Respiratory: Effort normal and breath sounds normal.  GI:       Gravid, Fundal height 38 Hypertrophic scar, raised, hyperpigmented c/w Keloid.  Neurological: She is alert and oriented to person, place, and time. She has normal reflexes.  Skin: Skin is warm and dry.  Psychiatric: She has a normal mood and affect.    PNL Wnl Quad Screen Neg GBS Neg  Fetal growth nl (MFM ultrasound q 4 months)     Assessment/Plan: Preg at 39 weeks H/o Previous c-section. Keloid  Repeat c-section with scar revision.  [name] 07/10/2011, 6:43 AM

## 2011-07-10 NOTE — Transfer of Care (Signed)
Immediate Anesthesia Transfer of Care Note  Patient: Mary Morton  Procedure(s) Performed:  CESAREAN SECTION  Patient Location: PACU  Anesthesia Type: Spinal  Level of Consciousness: awake, alert  and oriented  Airway & Oxygen Therapy: Patient Spontanous Breathing  Post-op Assessment: Report given to PACU RN and Post -op Vital signs reviewed and stable  Post vital signs: stable  Complications: No apparent anesthesia complications

## 2011-07-11 ENCOUNTER — Encounter (HOSPITAL_COMMUNITY): Payer: Self-pay | Admitting: Obstetrics and Gynecology

## 2011-07-11 LAB — CBC
MCH: 29.3 pg (ref 26.0–34.0)
MCHC: 33.4 g/dL (ref 30.0–36.0)
Platelets: 183 10*3/uL (ref 150–400)
RDW: 13 % (ref 11.5–15.5)

## 2011-07-11 NOTE — Progress Notes (Addendum)
Subjective: Postpartum Day 1: Cesarean Delivery Patient reports tolerating PO.  Pain well controlled.  Pt reports previous blood pressure readings were elevated due to a small cuff.  Baby doing well  Objective: Vital signs in last 24 hours: Temp:  [97.5 F (36.4 C)-98.9 F (37.2 C)] 98.9 F (37.2 C) (10/09 0612) Pulse Rate:  [47-79] 67  (10/09 0612) Resp:  [16-37] 18  (10/09 0612) BP: (109-172)/(69-100) 121/79 mmHg (10/09 0612) SpO2:  [96 %-100 %] 97 % (10/09 0612)  Physical Exam:  General: alert and cooperative Lochia: appropriate Uterine Fundus: firm Incision: Dressing clean and dry DVT Evaluation: No evidence of DVT seen on physical exam.   Basename 07/11/11 0532 07/10/11 1720  HGB 10.2* 11.2*  HCT 30.5* 33.2*   Preeclampsia labs normal.  Urine dip neg protein.  Assessment/Plan: Status post Cesarean section. Doing well postoperatively.  Elevated BP, not Preeclampsia. Started to increase prior to deliver.  Cont to observe closely (q 6 hours). Continue current care.  Mirielle Byrum 07/11/2011, 9:16 AM   Pt denies headaches or visual changes.

## 2011-07-12 ENCOUNTER — Other Ambulatory Visit: Payer: Self-pay

## 2011-07-12 DIAGNOSIS — R001 Bradycardia, unspecified: Secondary | ICD-10-CM | POA: Diagnosis not present

## 2011-07-12 DIAGNOSIS — I10 Essential (primary) hypertension: Secondary | ICD-10-CM | POA: Diagnosis not present

## 2011-07-12 LAB — COMPREHENSIVE METABOLIC PANEL
ALT: 7 U/L (ref 0–35)
AST: 17 U/L (ref 0–37)
Alkaline Phosphatase: 87 U/L (ref 39–117)
CO2: 24 mEq/L (ref 19–32)
Calcium: 9.6 mg/dL (ref 8.4–10.5)
Chloride: 102 mEq/L (ref 96–112)
Glucose, Bld: 112 mg/dL — ABNORMAL HIGH (ref 70–99)
Sodium: 137 mEq/L (ref 135–145)
Total Bilirubin: 0.3 mg/dL (ref 0.3–1.2)

## 2011-07-12 LAB — URINALYSIS, DIPSTICK ONLY
Bilirubin Urine: NEGATIVE
Glucose, UA: NEGATIVE mg/dL
Specific Gravity, Urine: 1.015 (ref 1.005–1.030)
Urobilinogen, UA: 0.2 mg/dL (ref 0.0–1.0)

## 2011-07-12 LAB — CBC
Hemoglobin: 10.7 g/dL — ABNORMAL LOW (ref 12.0–15.0)
MCH: 29.2 pg (ref 26.0–34.0)
MCHC: 33 g/dL (ref 30.0–36.0)
Platelets: 177 10*3/uL (ref 150–400)
RDW: 13.4 % (ref 11.5–15.5)

## 2011-07-12 LAB — TSH: TSH: 2.127 u[IU]/mL (ref 0.350–4.500)

## 2011-07-12 MED ORDER — POTASSIUM CHLORIDE CRYS ER 20 MEQ PO TBCR
40.0000 meq | EXTENDED_RELEASE_TABLET | Freq: Once | ORAL | Status: AC
  Administered 2011-07-12: 40 meq via ORAL
  Filled 2011-07-12: qty 2

## 2011-07-12 MED ORDER — NIFEDIPINE ER 30 MG PO TB24
30.0000 mg | ORAL_TABLET | Freq: Every day | ORAL | Status: DC
  Administered 2011-07-12 – 2011-07-13 (×2): 30 mg via ORAL
  Filled 2011-07-12 (×3): qty 1

## 2011-07-12 NOTE — Progress Notes (Signed)
Spoke with RN this am.  Pt's BP in the severe range.  170/80s.  No s/sxs of Preeclampsia.  BPs were normal yesterday afternoon and overnight.   Ordered Urine dip for protein.  Started Procardia XL 30 mg  Once daily. Pt 's heart rate is 40-50s when hypertensive. Called [facility] for consult for bradycardia.

## 2011-07-12 NOTE — Progress Notes (Signed)
Subjective: Postpartum Day 2: Cesarean Delivery Patient reports tolerating PO.  No headaches, visual changes.  Objective: Vital signs in last 24 hours: Temp:  [98 F (36.7 C)-98.6 F (37 C)] 98 F (36.7 C) (10/10 1411) Pulse Rate:  [47-85] 85  (10/10 1411) Resp:  [18-20] 20  (10/10 1411) BP: (122-171)/(73-97) 122/80 mmHg (10/10 1411)  Physical Exam:  General: alert and cooperative Lochia: appropriate Uterine Fundus: firm Incision: healing well DVT Evaluation: No evidence of DVT seen on physical exam.   Basename 07/11/11 0532 07/10/11 1720  HGB 10.2* 11.2*  HCT 30.5* 33.2*   Urine dip pending.  Assessment/Plan: Status post Cesarean section. Postoperative course complicated by elevated blood pressure, severe.  Added Procardia XL 30 mg earlier.  BPs normal.  Heart rate now normal. Consulted [facility] for bradycardia. Will check TSH but awaiting consult and have all labs drawn then.  Mary Morton 07/12/2011, 2:55 PM

## 2011-07-12 NOTE — Consult Note (Signed)
Requesting physician: [name], MD  Reason for consultation: HTN and Bradycardia  History of Present Illness: 30 y/o female with pmh of anemia and GERD; admitted for c-section. Now after C-section having hypertension and bradycardia. Patient without CP, SOB, nausea/vomiting, dizziness, lightheadedness or any other complaints associated with her main symptoms. At the time of my examinations patient was asymptomatic and after receiving a dose of procardia, BP and HR were stable.   Allergies:   Allergies  Allergen Reactions  . Latex Hives      Past Medical History  Diagnosis Date  . GERD (gastroesophageal reflux disease)     with pregnancy  . Nausea and vomiting 2012    with current pregnancy  . Anemia     Past Surgical History  Procedure Date  . Cholecystectomy 2012  . Cesarean section 2011  . Cesarean section 07/10/2011    Procedure: CESAREAN SECTION;  Surgeon: [name], MD;  Location: [facility];  Service: Gynecology;  Laterality: N/A;    Scheduled Meds:   . ferrous sulfate  325 mg Oral BID WC  . NIFEdipine  30 mg Oral Daily  . prenatal vitamin w/FE, FA  1 tablet Oral Daily  . scopolamine  1 patch Transdermal Once  . senna-docusate  2 tablet Oral QHS  . simethicone  80 mg Oral TID PC & HS  . TDaP  0.5 mL Intramuscular Once  . DISCONTD: ibuprofen  600 mg Oral Q6H   Continuous Infusions:   . lactated ringers 125 mL/hr at 07/11/11 0212  . naloxone (NARCAN) infusion     PRN Meds:.dibucaine, diphenhydrAMINE, diphenhydrAMINE, diphenhydrAMINE, diphenhydrAMINE, famotidine, ibuprofen, lanolin, menthol-cetylpyridinium, methylergonovine, methylergonovine, nalbuphine, nalbuphine, naloxone (NARCAN) infusion, naloxone, ondansetron (ZOFRAN) IV, ondansetron (ZOFRAN) IV, ondansetron, oxyCODONE-acetaminophen, simethicone, sodium chloride, witch hazel-glycerin, zolpidem, DISCONTD: metoCLOPramide (REGLAN) injection  Social History:  reports that she has never smoked. She does  not have any smokeless tobacco history on file. She reports that she does not drink alcohol or use illicit drugs.  History reviewed. No pertinent family history.  Review of Systems:  Constitutional: Denies fever, chills, diaphoresis, appetite change and fatigue.  HEENT: Denies photophobia, eye pain, redness, hearing loss, ear pain, congestion, sore throat, rhinorrhea, sneezing, mouth sores, trouble swallowing, neck pain, neck stiffness and tinnitus.   Respiratory: Denies SOB, DOE, cough, chest tightness,  and wheezing.   Cardiovascular: Denies chest pain, palpitations and leg swelling.  Gastrointestinal: Denies nausea, vomiting, abdominal pain, diarrhea, constipation, blood in stool and abdominal distention.  Genitourinary: Denies dysuria, urgency, frequency, hematuria, flank pain and difficulty urinating.  Musculoskeletal: Denies myalgias, back pain, joint swelling, arthralgias and gait problem.  Skin: Denies pallor, rash and wound.  Neurological: Denies dizziness, seizures, syncope, weakness, light-headedness, numbness and headaches.  Hematological: Denies adenopathy. Easy bruising, personal or family bleeding history  Psychiatric/Behavioral: Denies suicidal ideation, mood changes, confusion, nervousness, sleep disturbance and agitation   Physical Exam: Blood pressure 122/80, pulse 80, temperature 98 F (36.7 C), temperature source Oral, resp. rate 20, height 5\' 5"  (1.651 m), weight 101.606 kg (224 lb), SpO2 96.00%, unknown if currently breastfeeding. Gen: sitting on chair, NAD. HEENT: normocephalic, no trauma; eyes PERRLA,EOMI; moist mucose-membranes Resp: CTA bilaterally. Heart: RRR, no M/G/R Abdomen: +BS, soft, mild tenderness 2/2 surgery (according to patient mild) Ext: trace edema Skin: no rash or petechiae; multiple tattoos.  Neuro: Non-focal  Labs on Admission:  Results for orders placed during the hospital encounter of 07/10/11 (from the past 48 hour(s))  CBC     Status:  Abnormal  Collection Time   07/11/11  5:32 AM      Component Value Range Comment   WBC 14.4 (*) 4.0 - 10.5 (K/uL)    RBC 3.48 (*) 3.87 - 5.11 (MIL/uL)    Hemoglobin 10.2 (*) 12.0 - 15.0 (g/dL)    HCT 65.7 (*) 84.6 - 46.0 (%)    MCV 87.6  78.0 - 100.0 (fL)    MCH 29.3  26.0 - 34.0 (pg)    MCHC 33.4  30.0 - 36.0 (g/dL)    RDW 96.2  95.2 - 84.1 (%)    Platelets 183  150 - 400 (K/uL)   URINALYSIS, DIPSTICK ONLY     Status: Abnormal   Collection Time   07/12/11  3:20 PM      Component Value Range Comment   Specific Gravity, Urine 1.015  1.005 - 1.030     pH 6.5  5.0 - 8.0     Glucose, UA NEGATIVE  NEGATIVE (mg/dL)    Hgb urine dipstick LARGE (*) NEGATIVE     Bilirubin Urine NEGATIVE  NEGATIVE     Ketones, ur NEGATIVE  NEGATIVE (mg/dL)    Protein, ur NEGATIVE  NEGATIVE (mg/dL)    Urobilinogen, UA 0.2  0.0 - 1.0 (mg/dL)    Nitrite NEGATIVE  NEGATIVE     Leukocytes, UA NEGATIVE  NEGATIVE    COMPREHENSIVE METABOLIC PANEL     Status: Abnormal   Collection Time   07/12/11  3:40 PM      Component Value Range Comment   Sodium 137  135 - 145 (mEq/L)    Potassium 3.3 (*) 3.5 - 5.1 (mEq/L)    Chloride 102  96 - 112 (mEq/L)    CO2 24  19 - 32 (mEq/L)    Glucose, Bld 112 (*) 70 - 99 (mg/dL)    BUN 6  6 - 23 (mg/dL)    Creatinine, Ser <3.24 (*) 0.50 - 1.10 (mg/dL) REPEATED TO VERIFY   Calcium 9.6  8.4 - 10.5 (mg/dL)    Total Protein 6.3  6.0 - 8.3 (g/dL)    Albumin 2.6 (*) 3.5 - 5.2 (g/dL)    AST 17  0 - 37 (U/L)    ALT 7  0 - 35 (U/L)    Alkaline Phosphatase 87  39 - 117 (U/L)    Total Bilirubin 0.3  0.3 - 1.2 (mg/dL)    GFR calc non Af Amer NOT CALCULATED  >90 (mL/min)    GFR calc Af Amer NOT CALCULATED  >90 (mL/min)   CBC     Status: Abnormal   Collection Time   07/12/11  3:40 PM      Component Value Range Comment   WBC 11.5 (*) 4.0 - 10.5 (K/uL)    RBC 3.67 (*) 3.87 - 5.11 (MIL/uL)    Hemoglobin 10.7 (*) 12.0 - 15.0 (g/dL)    HCT 40.1 (*) 02.7 - 46.0 (%)    MCV 88.3  78.0 -  100.0 (fL)    MCH 29.2  26.0 - 34.0 (pg)    MCHC 33.0  30.0 - 36.0 (g/dL)    RDW 25.3  66.4 - 40.3 (%)    Platelets 177  150 - 400 (K/uL)     Radiological Exams on Admission: No results found.  Assessment/Plan 1-Hypertension: No prior hx of hypertension; agree with procardia daily for BP control; also will avoid NSAID and control her pain better. Follow BP trend.  2-Bradycardia: Check thyroid function. Avoid reglan; will replete potassium. Patient will  need a 2-D echo after discharge for further evaluation of her bradycardia.  3-Hypokalemia: Will replete potassium and check Mg level in am.  4-GERD: ok to use famotidine.  Time Spent on Admission: 40 minutes  Kent Riendeau 07/12/2011, 7:20 PM

## 2011-07-13 LAB — BASIC METABOLIC PANEL
CO2: 29 mEq/L (ref 19–32)
Chloride: 103 mEq/L (ref 96–112)
Sodium: 136 mEq/L (ref 135–145)

## 2011-07-13 LAB — MAGNESIUM: Magnesium: 1.6 mg/dL (ref 1.5–2.5)

## 2011-07-13 MED ORDER — OXYCODONE-ACETAMINOPHEN 5-325 MG PO TABS: 1.0000 | ORAL_TABLET | ORAL | Status: AC | PRN

## 2011-07-13 MED ORDER — NIFEDIPINE ER 30 MG PO TB24: 30.0000 mg | ORAL_TABLET | Freq: Every day | ORAL | Status: DC

## 2011-07-13 MED ORDER — IBUPROFEN 600 MG PO TABS: 600.0000 mg | ORAL_TABLET | Freq: Four times a day (QID) | ORAL | Status: AC | PRN

## 2011-07-13 NOTE — Progress Notes (Signed)
Subjective: Postpartum Day 3: Cesarean Delivery Patient reports tolerating PO.  Denies chest pain, SOB, dizziness.  Pain controlled.  Objective: Vital signs in last 24 hours: Temp:  [97.8 F (36.6 C)-98.2 F (36.8 C)] 98.1 F (36.7 C) (10/11 1015) Pulse Rate:  [57-85] 69  (10/11 1015) Resp:  [16-20] 18  (10/11 1015) BP: (122-152)/(75-89) 148/88 mmHg (10/11 1015)  Physical Exam:  General: alert and cooperative Lochia: appropriate Uterine Fundus: firm Incision: healing well, no significant drainage DVT Evaluation: No evidence of DVT seen on physical exam.   Basename 07/12/11 1540 07/11/11 0532  HGB 10.7* 10.2*  HCT 32.4* 30.5*   TSH nl. K 3.3 to 4.2 this am. EKG nl, NSR.  HR 63.   Assessment/Plan: Status post Cesarean section. Postoperative course complicated by HTN and bradycardia.  Pt doing better on Procardia XL 30 mg. Discharge home. F/u in 1 week for BP check. Schedule 2D cardiac echo as an outpatient.  [name] 07/13/2011, 12:04 PM

## 2011-07-13 NOTE — Discharge Summary (Signed)
Obstetric Discharge Summary Reason for Admission: cesarean section Prenatal Procedures: ultrasound Intrapartum Procedures: cesarean: low cervical, transverse Postpartum Procedures: EKG Complications-Operative and Postpartum: Hypertension and bradycardia.  Started Procardia XL 30 mg. Hemoglobin  Date Value Range Status  07/12/2011 10.7* 12.0-15.0 (g/dL) Final     HCT  Date Value Range Status  07/12/2011 32.4* 36.0-46.0 (%) Final    Discharge Diagnoses: Term Pregnancy-delivered and Gestational Hypertension, Bradycardia  Discharge Information: Date: 07/13/2011 Activity: pelvic rest and No heavy lifting. Diet: routine and Low sodium Medications: PNV, Ibuprofen and Percocet  Ibuprofen to start in 3 days. Procardia XL 30 mg one po daily. Condition: improved Instructions: refer to practice specific booklet and Routine Discharge to: home Follow-up Information    Follow up with [name], MD. (BP check, Schedule 2D echo)    Contact information:   [facility] [address] [place] [address] [phone]          Newborn Data: Live born female  Birth Weight: 6 lb 7.7 oz (2940 g) APGAR: 9, 9 Circ done DOL #1  Home with mother.  [name] 07/13/2011, 12:24 PM

## 2011-07-19 ENCOUNTER — Encounter (HOSPITAL_COMMUNITY): Payer: Self-pay | Admitting: *Deleted

## 2012-07-31 IMAGING — US US ABDOMEN COMPLETE
1 series · 14 of 25 positions shown · non-contrast
Comparison: None.

CLINICAL DATA: Abdominal pain, epigastric pain

COMPLETE ABDOMINAL ULTRASOUND

[Series 1: us abdomen complete · 0.24mm/px · 14 of 97 slices shown]
[im 1/97]
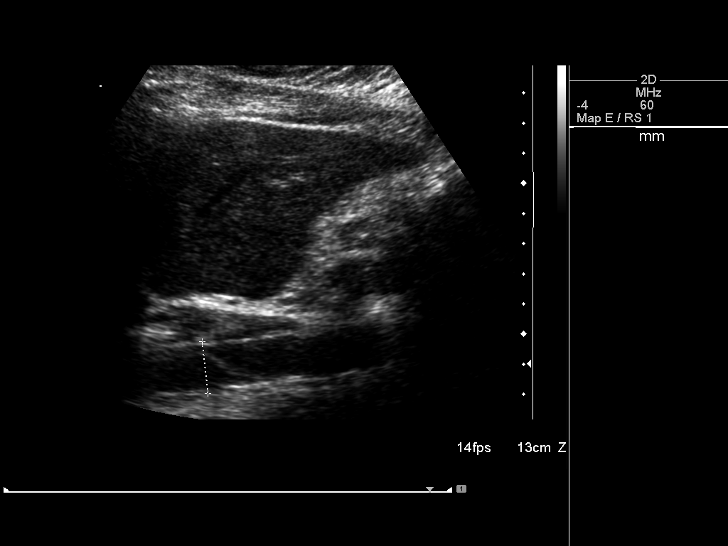
[im 9/97]
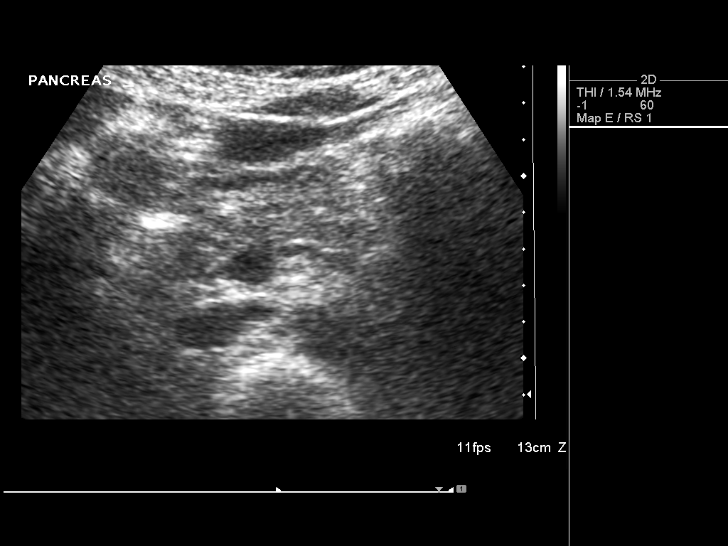
[im 17/97]
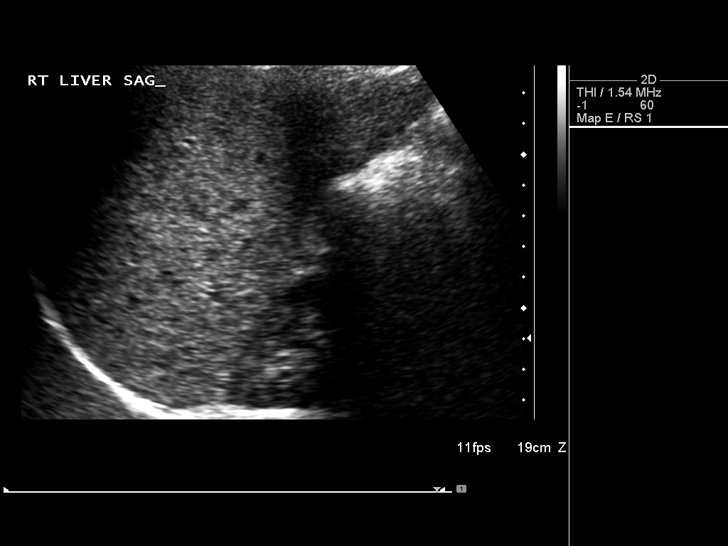
[im 25/97]
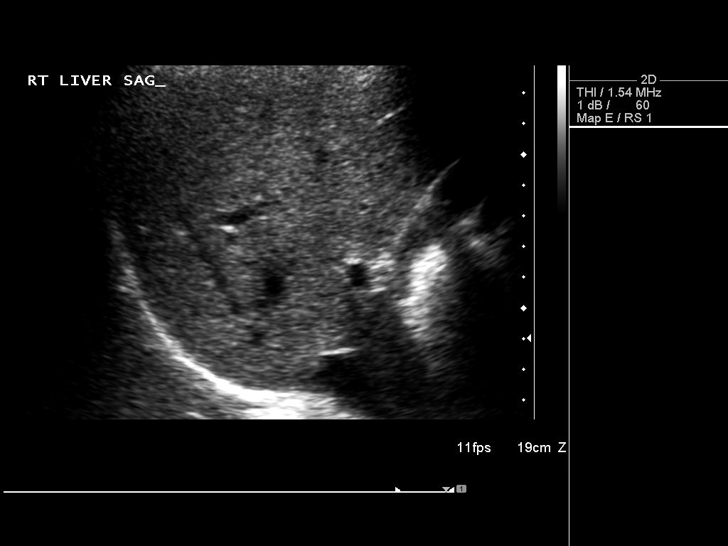
[im 33/97]
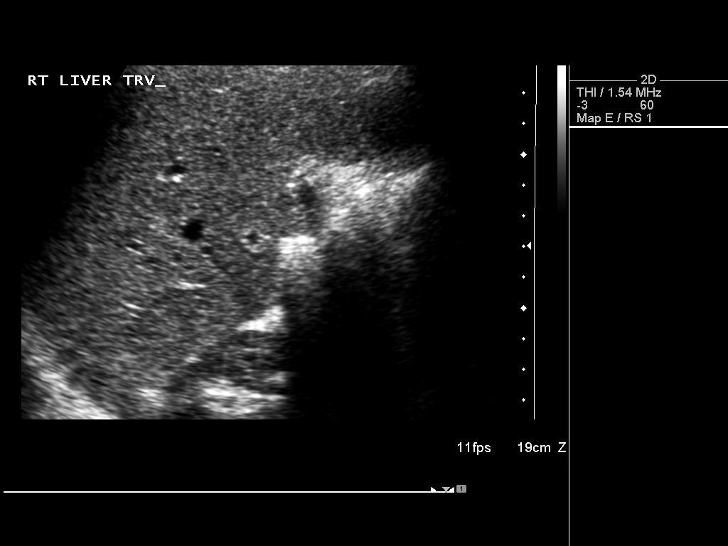
[im 37/97]
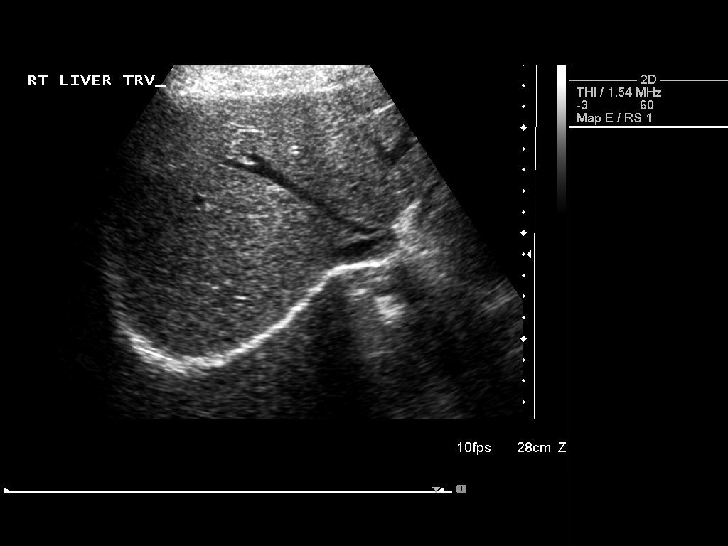
[im 45/97]
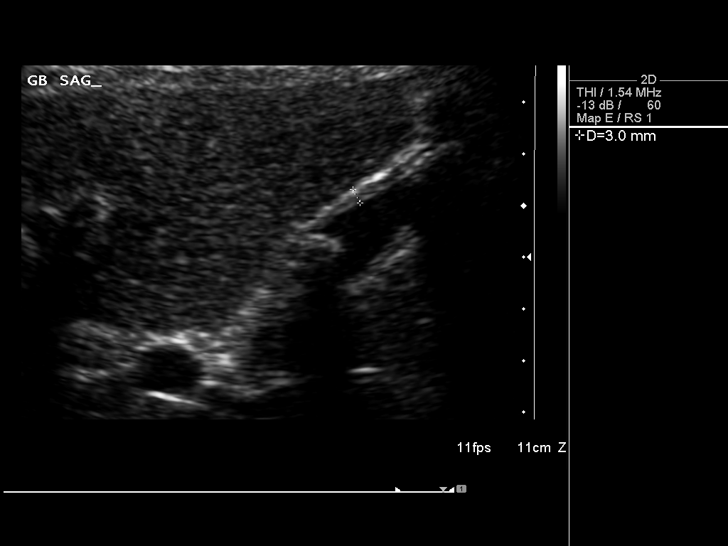
[im 53/97]
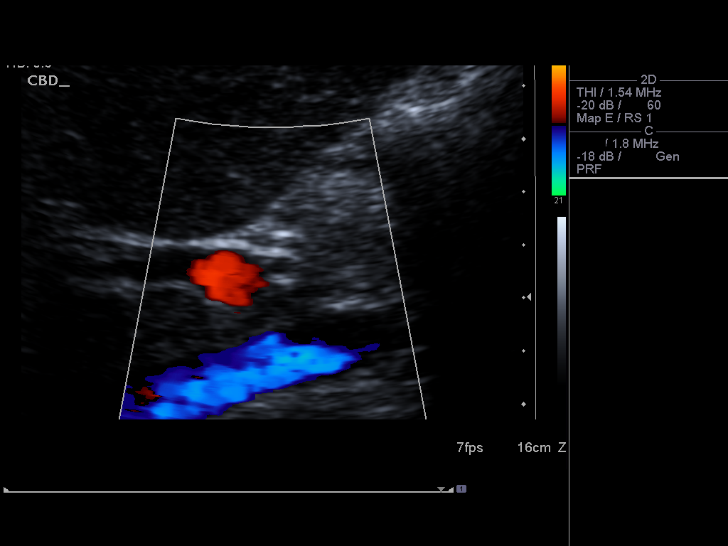
[im 61/97]
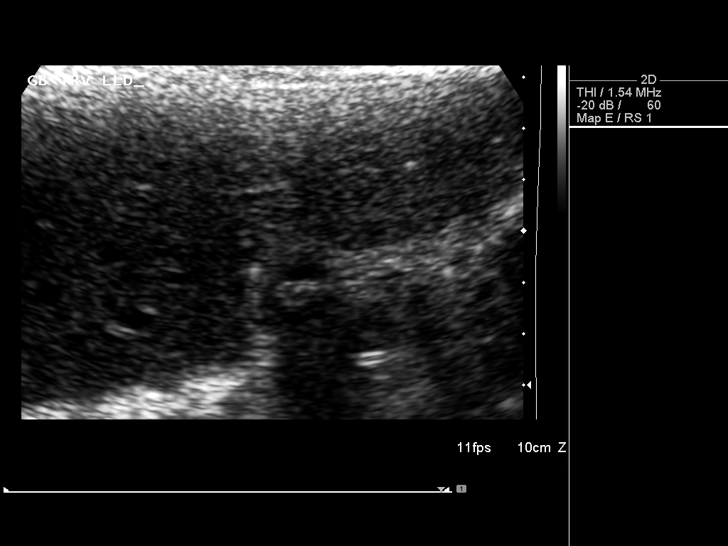
[im 65/97]
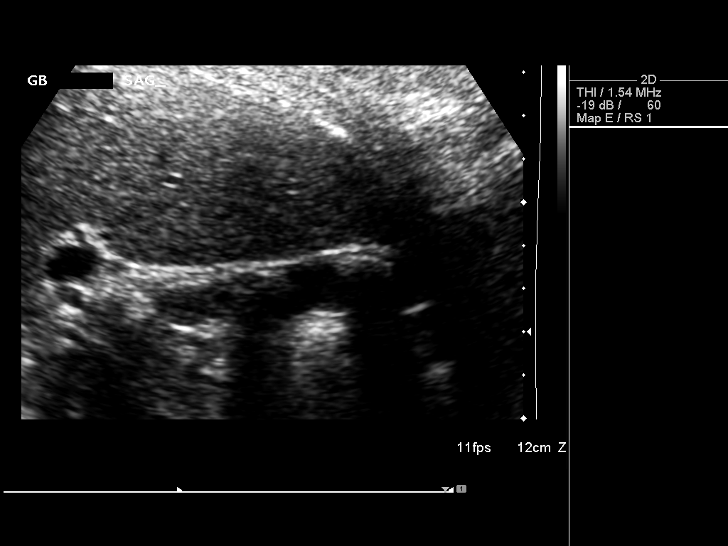
[im 73/97]
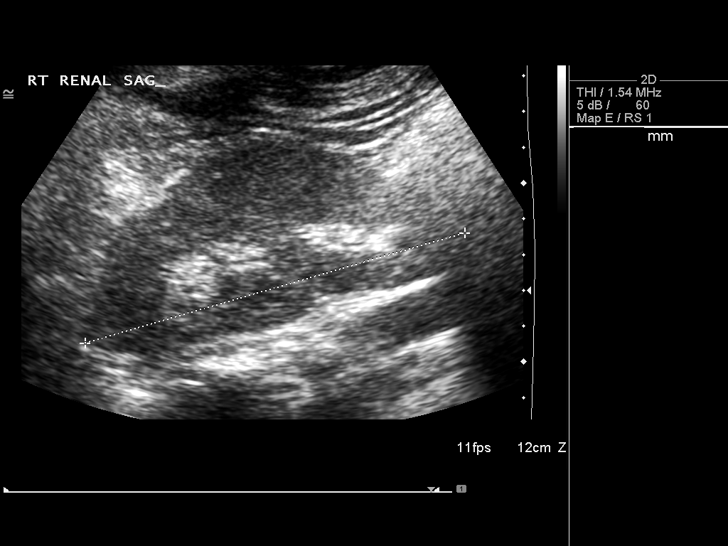
[im 81/97]
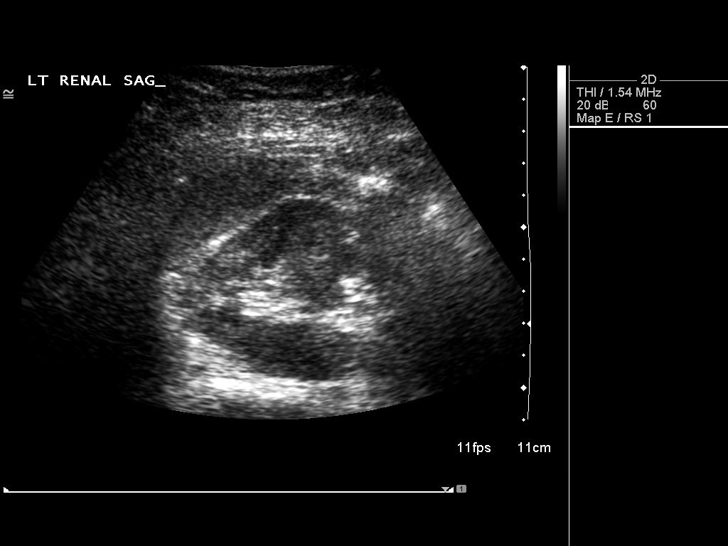
[im 89/97]
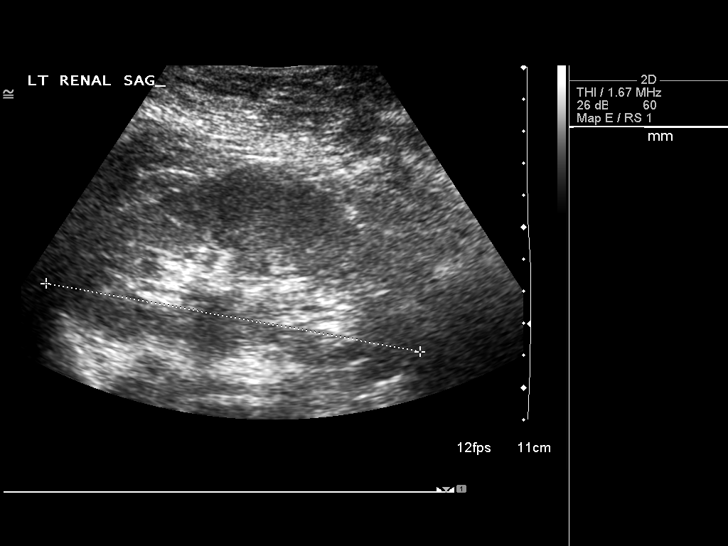
[im 97/97]
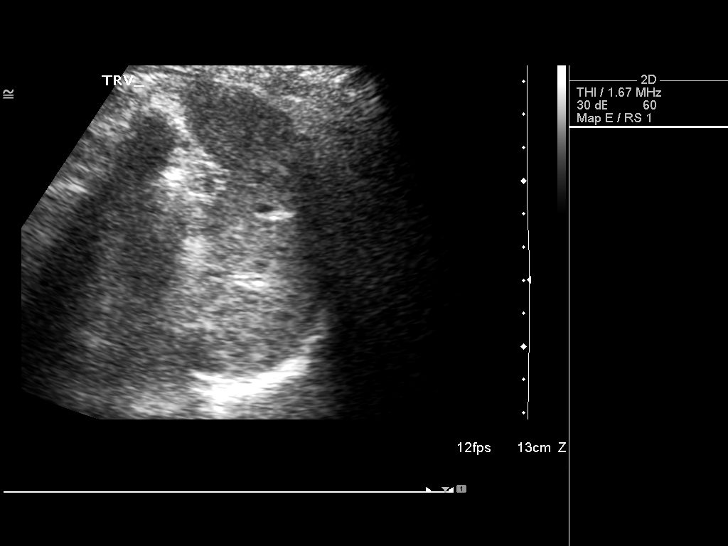

[14 of 25 positions shown; findings below may reference images not displayed]

FINDINGS: Gallbladder:  Multiple mobile gallstones are noted within
gallbladder the largest measures about 9 mm.  Borderline thickening
of the gallbladder wall up to 3.2 mm thickness.  No sonographic
Murphy's sign.

Common bile duct:  Measures 2.8 mm in diameter within normal
limits.

Liver:  No focal lesion identified.  Within normal limits in
parenchymal echogenicity.

IVC:  Appears normal.

Pancreas:  No focal abnormality seen. Limited visualization due to
bowel gas.

Spleen:  Measures 5.7 cm in length.  Normal echogenicity.

Right Kidney:  Measures 11 cm in length.  No hydronephrosis or
diagnostic renal calculus

Left Kidney:  Measures 11.9 cm in length.  No hydronephrosis or
diagnostic renal calculus

Abdominal aorta:  No aneurysm identified. Measures up to 1.7 cm in
diameter.
IMPRESSION: 1.  Multiple mobile gallstones are noted within gallbladder the
largest measures 0.9 mm.  Borderline thickening of gallbladder wall
without sonographic Murphy's sign.
2.  No hydronephrosis or diagnostic renal calculus.
3.  No aortic aneurysm.

## 2012-10-15 IMAGING — US US OB DETAIL+14 WK
1 series · 14 of 28 positions shown · non-contrast
Comparison: none

[Series 1: us ob detail+14 wk · 0.19mm/px · 14 of 113 slices shown]
[im 5/113]
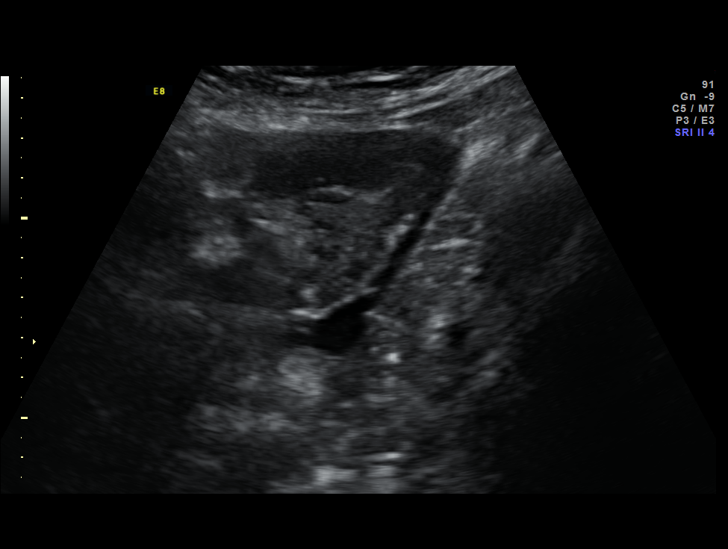
[im 13/113]
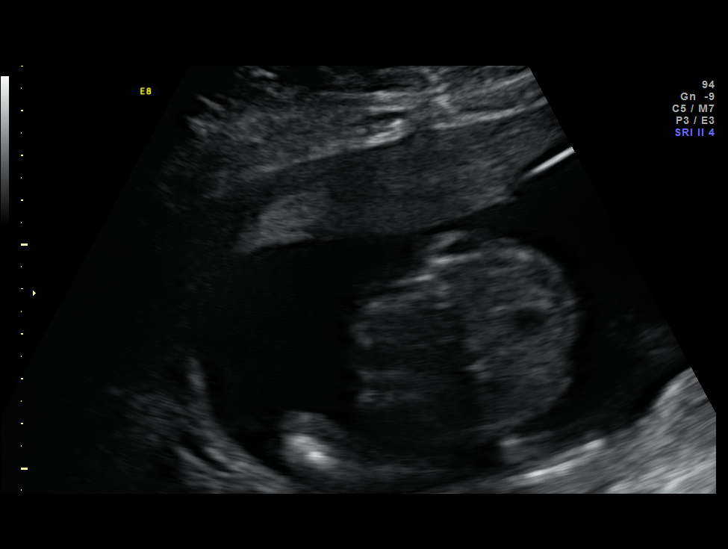
[im 21/113]
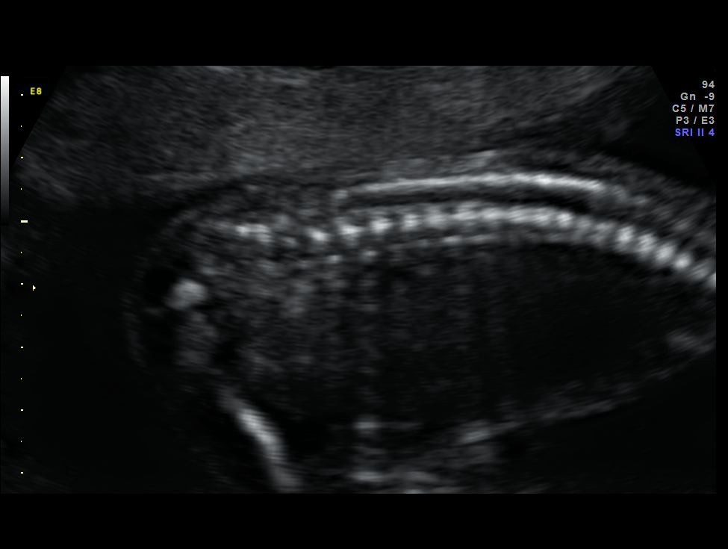
[im 30/113]
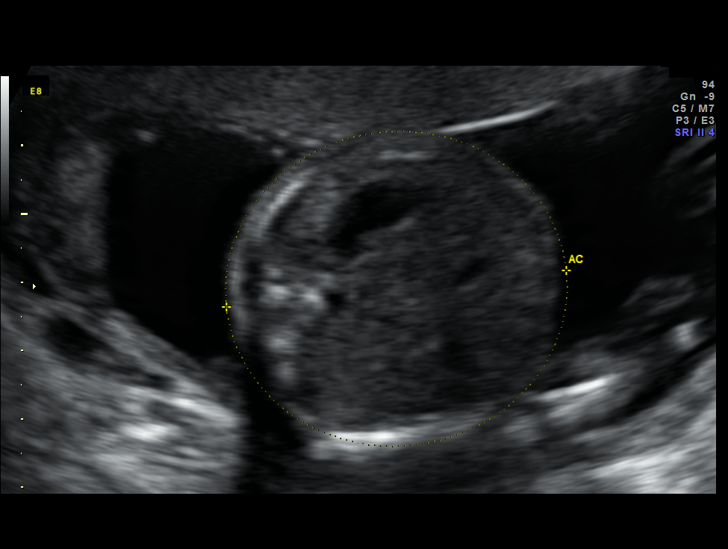
[im 38/113]
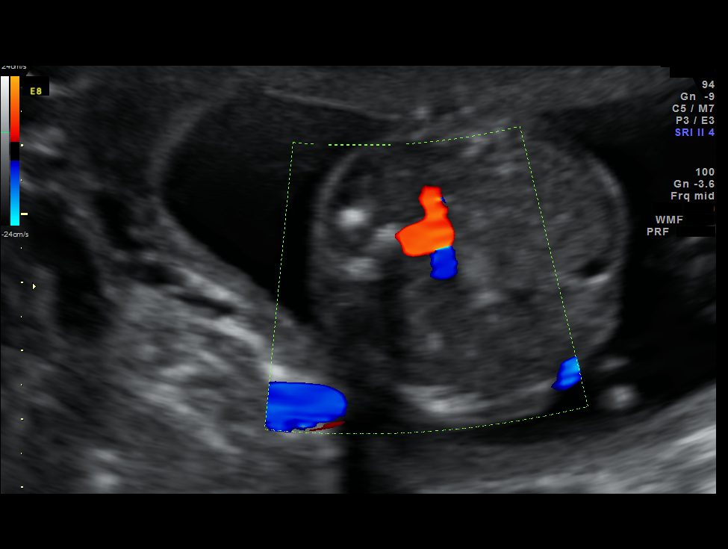
[im 46/113]
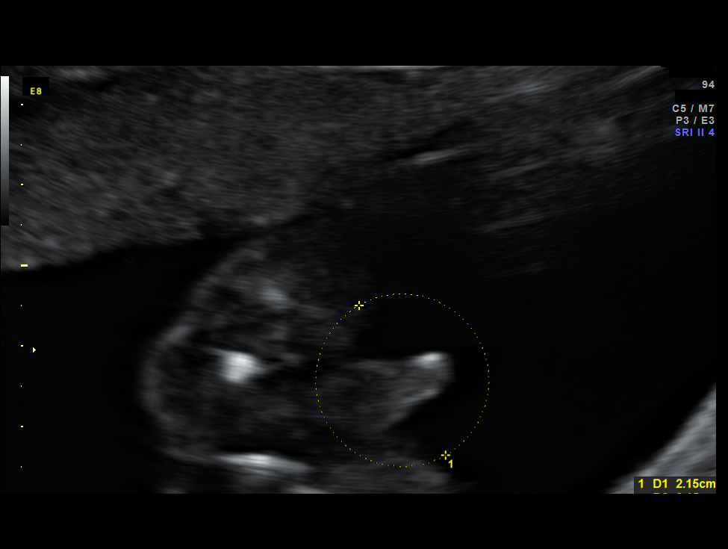
[im 54/113]
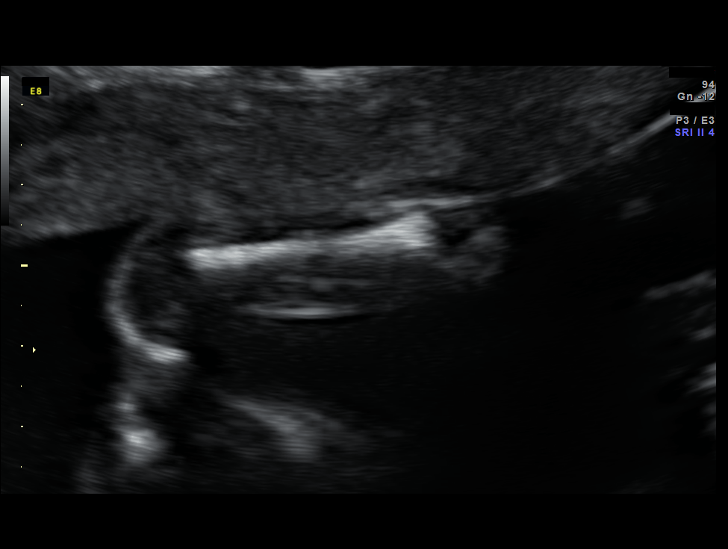
[im 63/113]
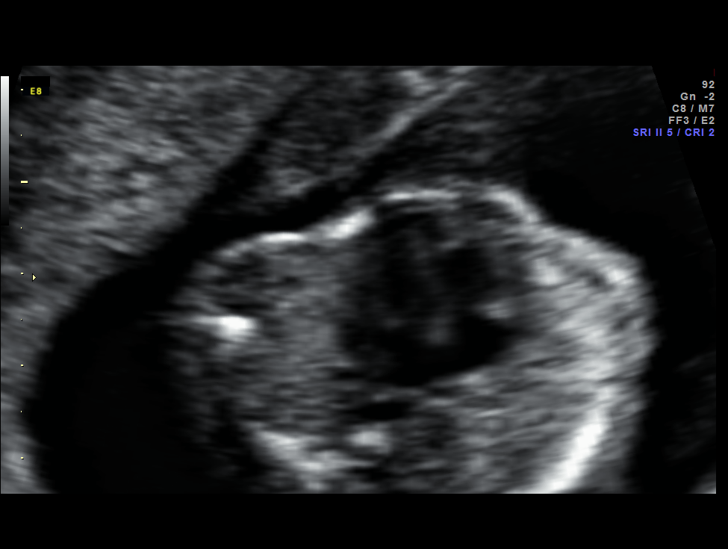
[im 71/113]
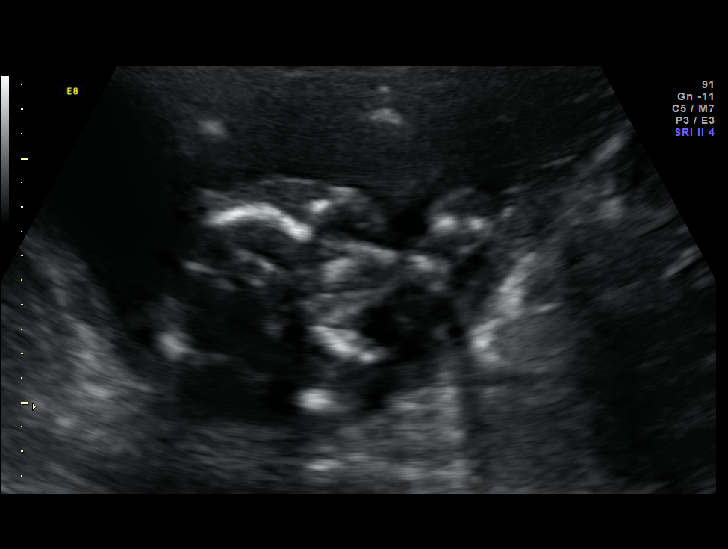
[im 79/113]
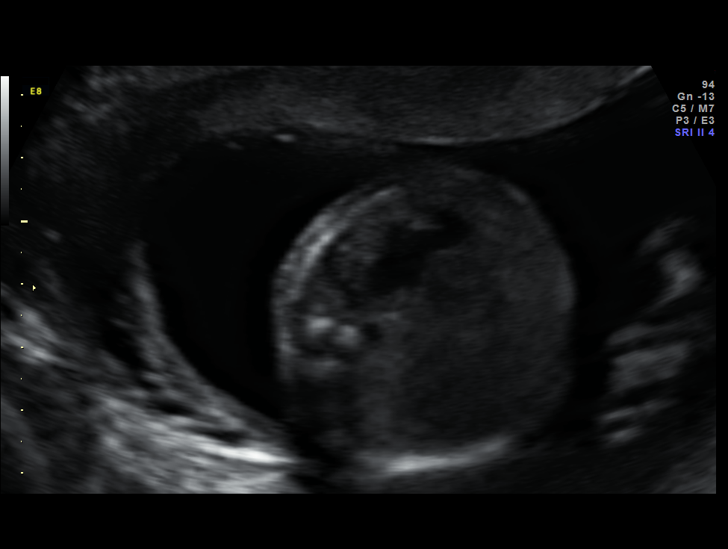
[im 88/113]
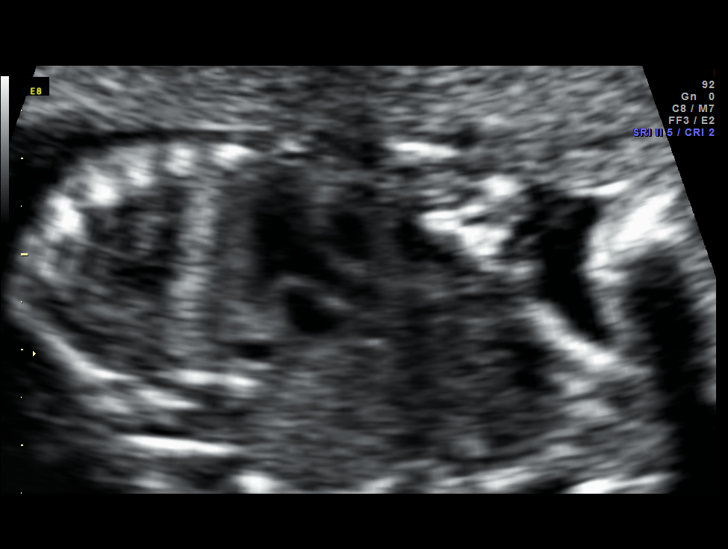
[im 96/113]
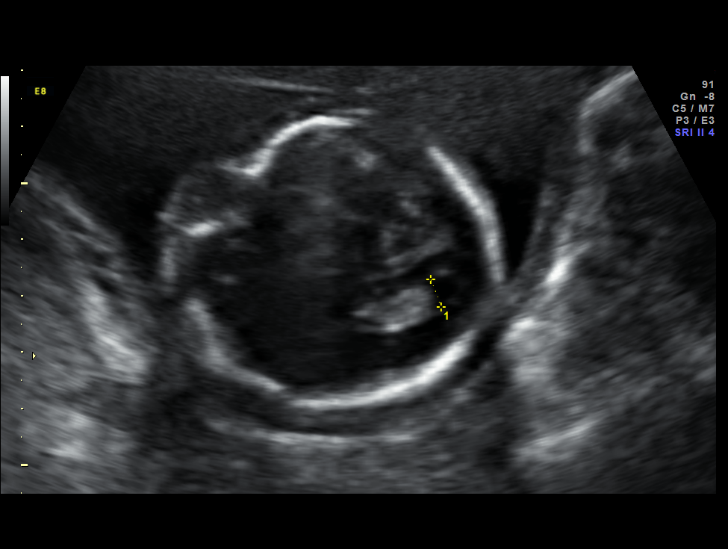
[im 104/113]
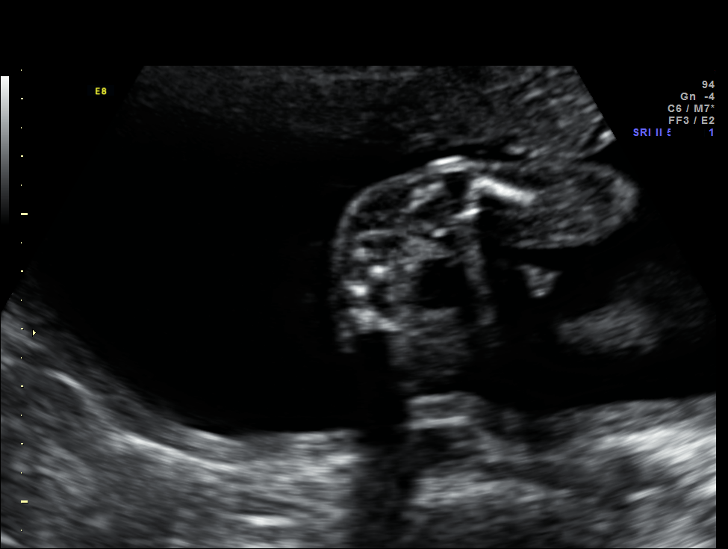
[im 113/113]
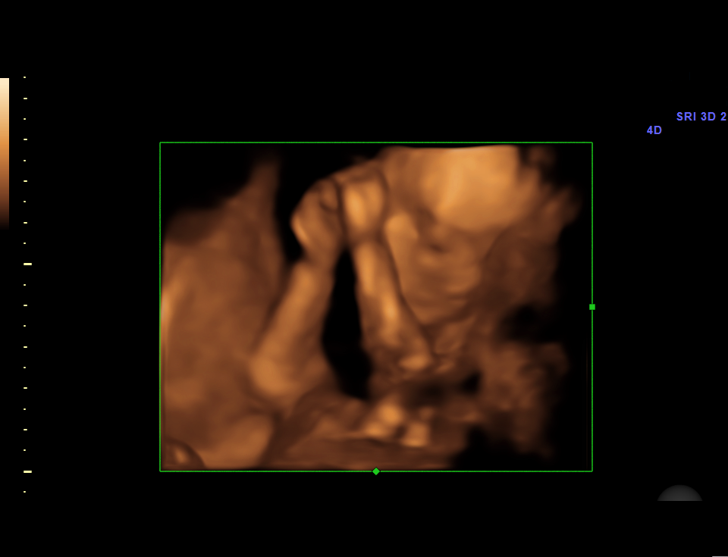

[14 of 28 positions shown; findings below may reference images not displayed]

Canned report from images found in remote index.

Refer to host system for actual result text.

## 2012-11-28 ENCOUNTER — Other Ambulatory Visit: Payer: Self-pay | Admitting: Obstetrics and Gynecology

## 2012-11-28 ENCOUNTER — Other Ambulatory Visit (HOSPITAL_COMMUNITY)
Admission: RE | Admit: 2012-11-28 | Discharge: 2012-11-28 | Disposition: A | Discharge status: Home / Self Care | Payer: 59 | Source: Ambulatory Visit | Attending: Obstetrics and Gynecology | Admitting: Obstetrics and Gynecology

## 2012-11-28 DIAGNOSIS — Z01419 Encounter for gynecological examination (general) (routine) without abnormal findings: Secondary | ICD-10-CM | POA: Insufficient documentation

## 2012-11-28 DIAGNOSIS — Z1151 Encounter for screening for human papillomavirus (HPV): Secondary | ICD-10-CM | POA: Insufficient documentation

## 2013-02-04 IMAGING — US US OB FOLLOW-UP
1 series · 14 of 28 positions shown · non-contrast
Comparison: none

[Series 1: us ob follow-up · 43 acquisitions, 14 frames shown]
[im 2/43]
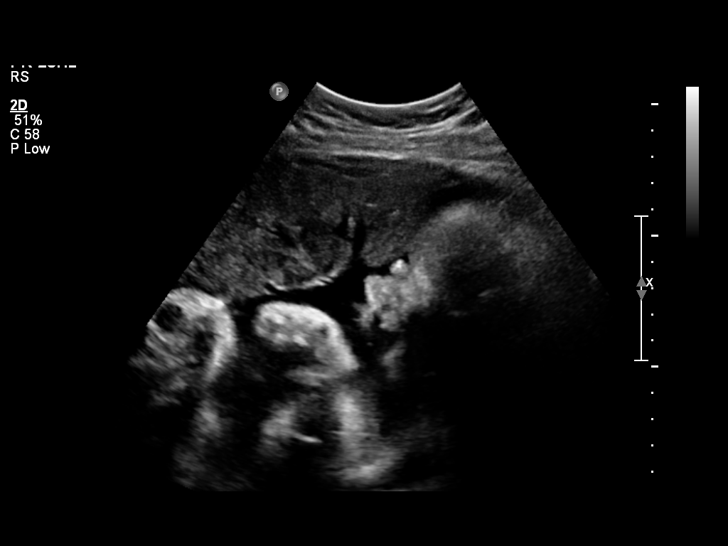
[im 5/43]
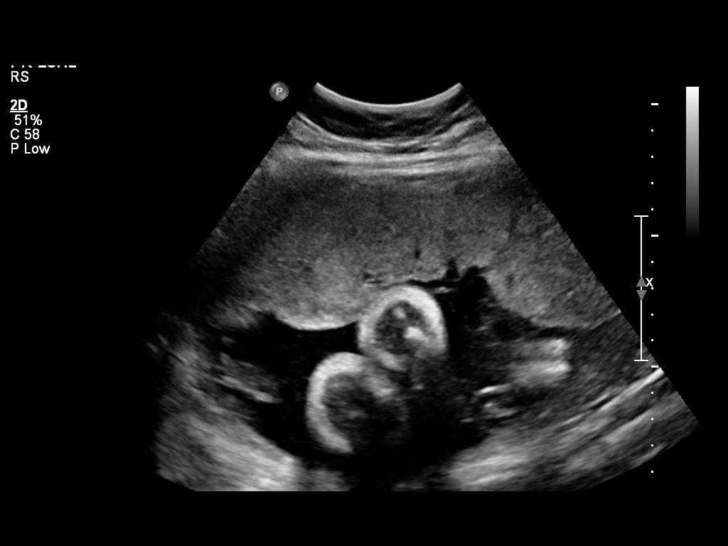
[im 8/43]
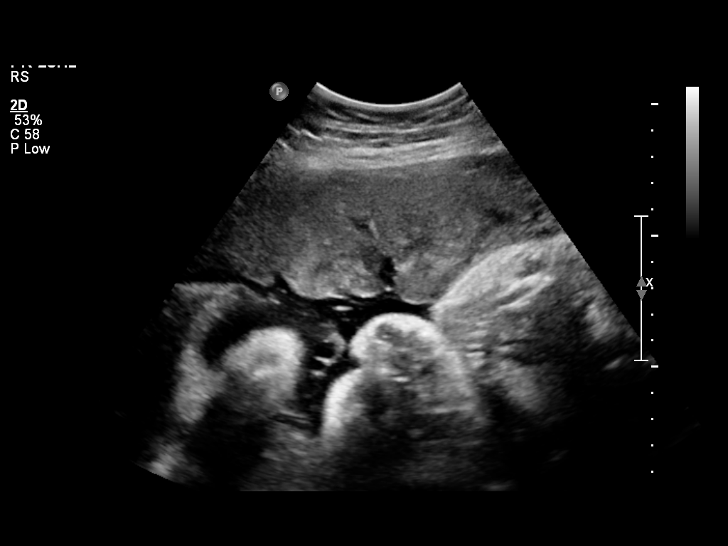
[im 11/43]
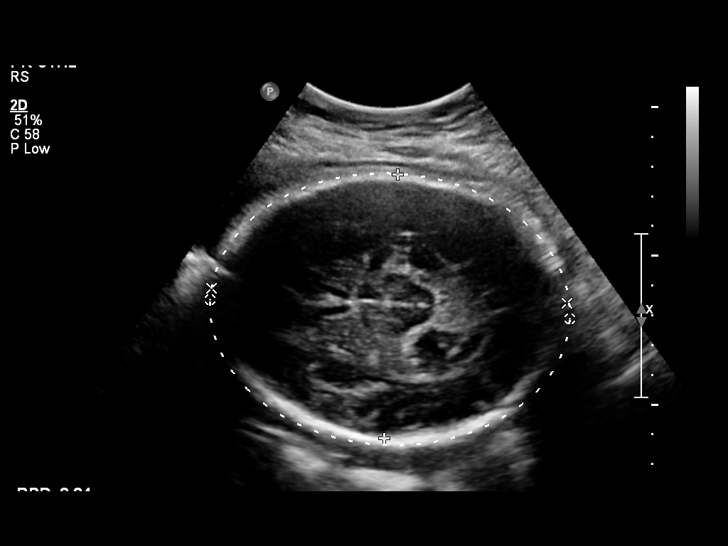
[im 15/43]
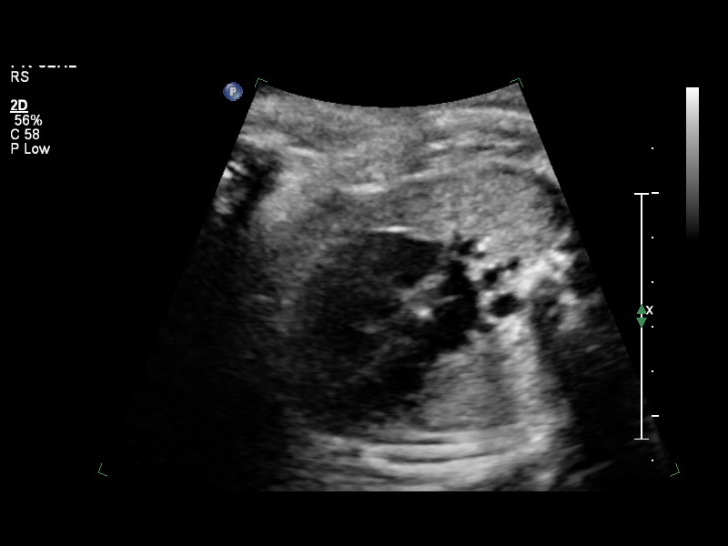
[im 18/43]
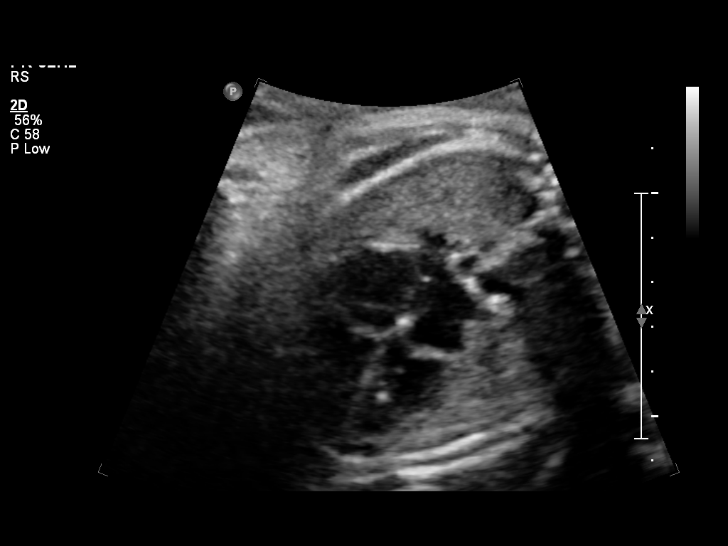
[im 21/43]
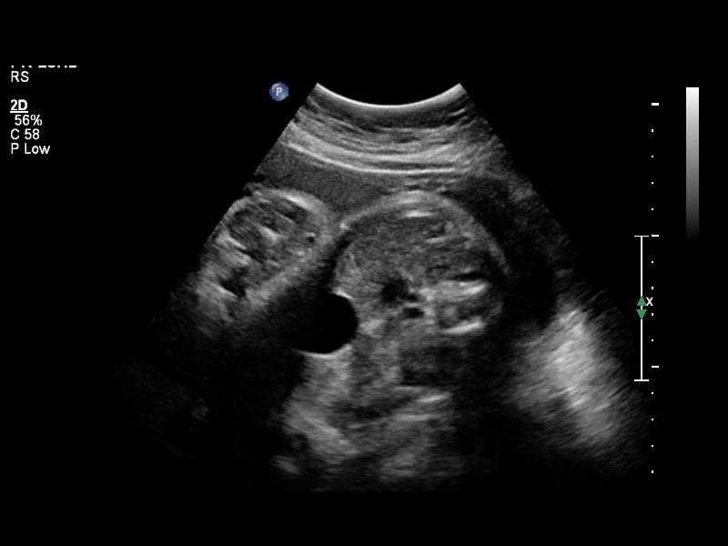
[im 24/43]
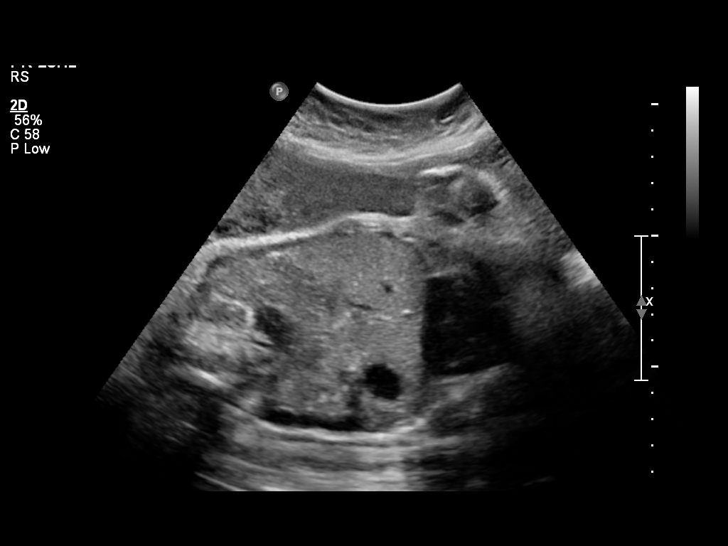
[im 27/43]
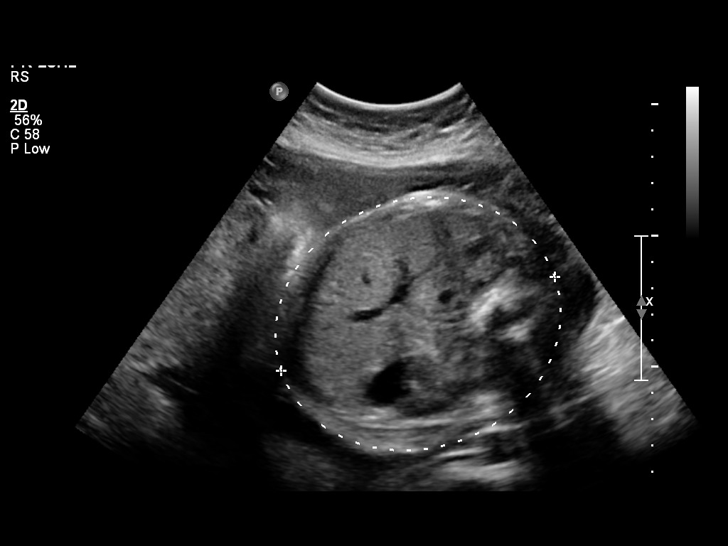
[im 30/43]
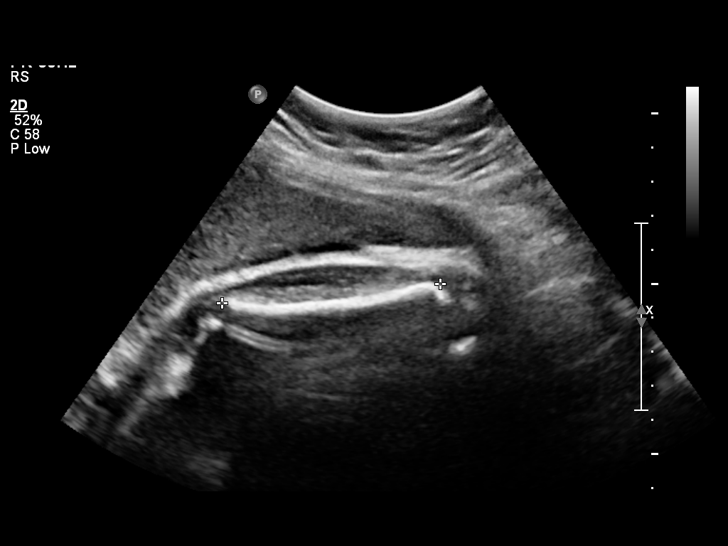
[im 33/43]
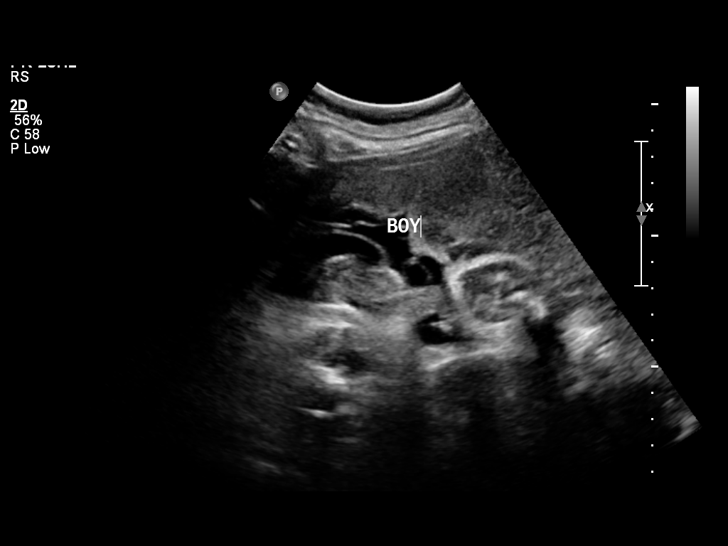
[im 36/43]
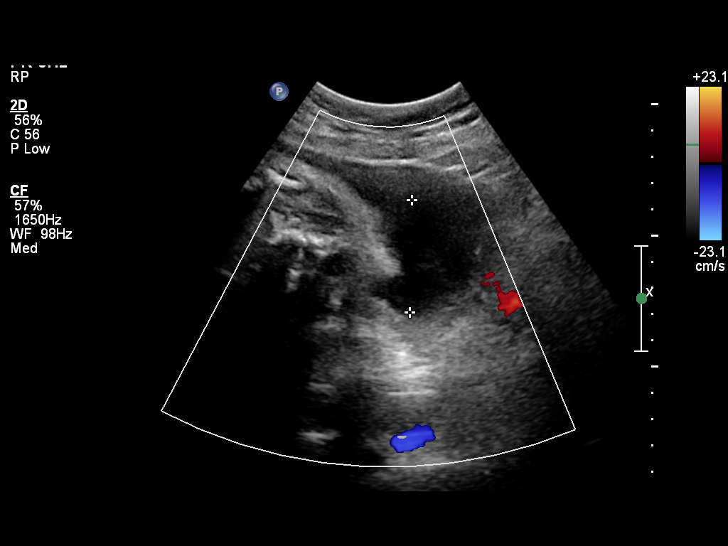
[im 39/43]
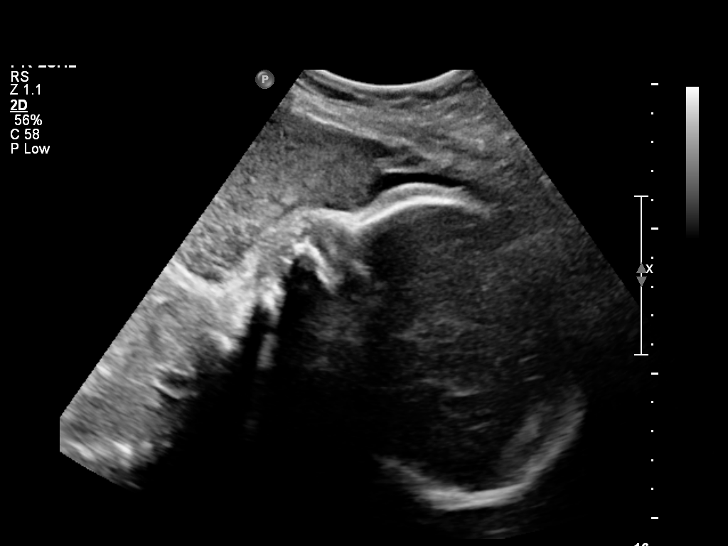
[im 43/43]
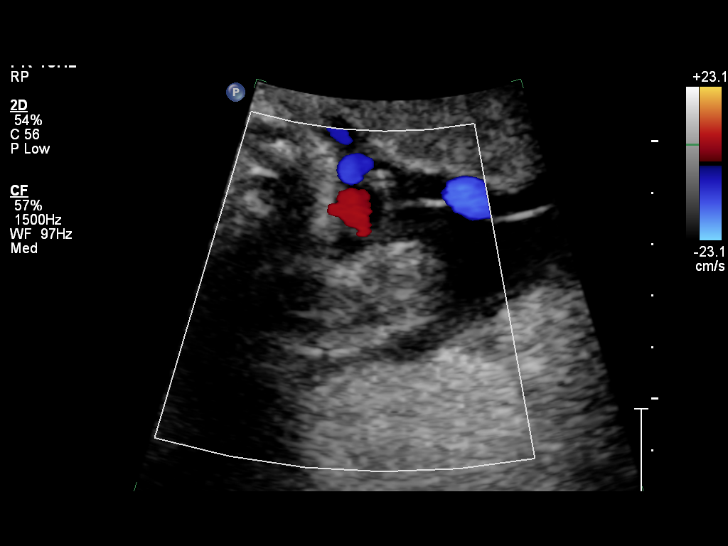

[14 of 28 positions shown; findings below may reference images not displayed]

Canned report from images found in remote index.

Refer to host system for actual result text.

## 2014-01-24 ENCOUNTER — Encounter: Payer: Self-pay | Admitting: *Deleted

## 2014-08-03 ENCOUNTER — Encounter: Payer: Self-pay | Admitting: *Deleted

## 2014-12-01 ENCOUNTER — Other Ambulatory Visit: Payer: Self-pay | Admitting: Dermatology

## 2018-05-10 ENCOUNTER — Encounter: Payer: Self-pay | Admitting: Certified Nurse Midwife

## 2018-05-10 ENCOUNTER — Ambulatory Visit (INDEPENDENT_AMBULATORY_CARE_PROVIDER_SITE_OTHER): Payer: BC Managed Care – PPO | Admitting: Certified Nurse Midwife

## 2018-05-10 ENCOUNTER — Other Ambulatory Visit (HOSPITAL_COMMUNITY)
Admission: RE | Admit: 2018-05-10 | Discharge: 2018-05-10 | Disposition: A | Discharge status: Home / Self Care | Payer: BC Managed Care – PPO | Source: Ambulatory Visit | Attending: Certified Nurse Midwife | Admitting: Certified Nurse Midwife

## 2018-05-10 VITALS — BP 105/69 | HR 69 | Ht 65.0 in | Wt 214.4 lb

## 2018-05-10 DIAGNOSIS — Z124 Encounter for screening for malignant neoplasm of cervix: Secondary | ICD-10-CM

## 2018-05-10 DIAGNOSIS — Z6835 Body mass index (BMI) 35.0-35.9, adult: Secondary | ICD-10-CM

## 2018-05-10 DIAGNOSIS — Z3169 Encounter for other general counseling and advice on procreation: Secondary | ICD-10-CM | POA: Diagnosis not present

## 2018-05-10 DIAGNOSIS — Z01419 Encounter for gynecological examination (general) (routine) without abnormal findings: Secondary | ICD-10-CM

## 2018-05-10 NOTE — Patient Instructions (Signed)

## 2018-05-12 ENCOUNTER — Encounter: Payer: Self-pay | Admitting: Certified Nurse Midwife

## 2018-05-12 NOTE — Progress Notes (Signed)
ANNUAL PREVENTATIVE CARE GYN  ENCOUNTER NOTE  Subjective:       Mary Morton is a 37 y.o. [id number] female here for a routine annual gynecologic exam.  Current complaints: 1. Needs Pap smear 2. Preconception consultation  Taking a prenatal vitamin daily. Actively trying to conceive for the last three (3) months. Using ovulation kits and cycle tracking.   History significant for two (2) previous cesarean section and gestational hypertension.   Denies difficulty breathing or respiratory distress, chest pain, abdominal pain, excessive vaginal bleeding, dysuria, and leg pain or swelling.    Gynecologic History  Patient's last menstrual period was 04/24/2018.  Contraception: none  Last Pap: 2016. Results were: normal  Obstetric History  OB History  Gravida Para Term Preterm AB Living  3 2 2  0 0 2  SAB TAB Ectopic Multiple Live Births  0 0 0 0 2    # Outcome Date GA Lbr Len/2nd Weight Sex Delivery Anes PTL Lv  3 Term 07/10/11 1412w1d  6 lb 7.7 oz (2.94 kg) M CS-LTranv Spinal  LIV  2 Term 03/01/10 5957w0d 09:00 5 lb (2.268 kg) F CS-Unspec   LIV  1 Gravida              Birth Comments: System Generated. Please review and update pregnancy details.    Past Medical History:  Diagnosis Date  . Anemia   . GERD (gastroesophageal reflux disease)   . Nausea and vomiting 2012    Past Surgical History:  Procedure Laterality Date  . CESAREAN SECTION  2011  . CESAREAN SECTION  07/10/2011   Procedure: CESAREAN SECTION;  Surgeon: Geryl RankinsEvelyn Varnado, MD;  Location: [facility];  Service: Gynecology;  Laterality: N/A;  . CHOLECYSTECTOMY  2012    Current Outpatient Medications on File Prior to Visit  Medication Sig Dispense Refill  . Prenatal Vit-Fe Fumarate-FA (PRENATAL MULTIVITAMIN) TABS tablet Take 1 tablet by mouth daily at 12 noon.     No current facility-administered medications on file prior to visit.     Allergies  Allergen Reactions  . Latex Hives    Social History    Socioeconomic History  . Marital status: Married    Spouse name: Not on file  . Number of children: Not on file  . Years of education: Not on file  . Highest education level: Not on file  Occupational History  . Not on file  Social Needs  . Financial resource strain: Not on file  . Food insecurity:    Worry: Not on file    Inability: Not on file  . Transportation needs:    Medical: Not on file    Non-medical: Not on file  Tobacco Use  . Smoking status: Never Smoker  . Smokeless tobacco: Never Used  Substance and Sexual Activity  . Alcohol use: No  . Drug use: No  . Sexual activity: Yes  Lifestyle  . Physical activity:    Days per week: Not on file    Minutes per session: Not on file  . Stress: Not on file  Relationships  . Social connections:    Talks on phone: Not on file    Gets together: Not on file    Attends religious service: Not on file    Active member of club or organization: Not on file    Attends meetings of clubs or organizations: Not on file    Relationship status: Not on file  . Intimate partner violence:    Fear of current or  ex partner: Not on file    Emotionally abused: Not on file    Physically abused: Not on file    Forced sexual activity: Not on file  Other Topics Concern  . Not on file  Social History Narrative  . Not on file    Family History  Problem Relation Age of Onset  . Hypertension Maternal Grandmother   . Hypertension Maternal Grandfather   . Hypertension Paternal Grandmother   . Hypertension Paternal Grandfather   . Diabetes Paternal Grandfather   . Cancer Paternal Grandfather     The following portions of the patient's history were reviewed and updated as appropriate: allergies, current medications, past family history, past medical history, past social history, past surgical history and problem list.  Review of Systems ROS negative except as noted above. Information obtained from patient.    Objective:   BP 105/69    Pulse 69   Ht 5\' 5"  (1.651 m)   Wt 214 lb 6.4 oz (97.3 kg)   LMP 04/24/2018   BMI 35.68 kg/m    CONSTITUTIONAL: Well-developed, well-nourished female in no acute distress.   PSYCHIATRIC: Normal mood and affect. Normal behavior. Normal judgment and thought content.  NEUROLGIC: Alert and oriented to person, place, and time. Normal muscle tone coordination. No cranial nerve deficit noted.  HENT:  Normocephalic, atraumatic, External right and left ear normal.   EYES: Conjunctivae and EOM are normal. Pupils are equal and round.   NECK: Normal range of motion, supple, no masses.  Normal thyroid.   SKIN: Skin is warm and dry. No rash noted. Not diaphoretic. No erythema. No pallor.  CARDIOVASCULAR: Normal heart rate noted, regular rhythm, no murmur.  RESPIRATORY: Clear to auscultation bilaterally. Effort and breath sounds normal, no problems with respiration noted.  BREASTS: Symmetric in size. No masses, skin changes, nipple drainage, or lymphadenopathy.  ABDOMEN: Soft, normal bowel sounds, no distention noted.  No tenderness, rebound or guarding. Obese.   PELVIC:  External Genitalia: Normal  Vagina: Normal  Cervix: Normal  Uterus: Normal  Adnexa: Normal   MUSCULOSKELETAL: Normal range of motion. No tenderness.  No cyanosis, clubbing, or edema.  2+ distal pulses.  LYMPHATIC: No Axillary, Supraclavicular, or Inguinal Adenopathy.  Assessment:   Annual gynecologic examination 37 y.o.   Contraception: none   Obesity 1   Problem List Items Addressed This Visit    None    Visit Diagnoses    Well woman exam    -  Primary   Relevant Orders   Thyroid Panel With TSH   CBC   Comprehensive metabolic panel   Lipid panel   Cytology - PAP   Beta HCG, Quant   Anti mullerian hormone   FSH/LH   Hemoglobin A1c   Progesterone   Encounter for preconception consultation       Relevant Orders   Anti mullerian hormone   FSH/LH   Progesterone   BMI 35.0-35.9,adult       Relevant  Orders   Thyroid Panel With TSH   Lipid panel   Hemoglobin A1c   Screening for cervical cancer       Relevant Orders   Cytology - PAP      Plan:   Pap: Pap Co Test  Labs: See orders   Routine preventative health maintenance measures emphasized: Exercise/Diet/Weight control, Tobacco Warnings, Alcohol/Substance use risks and Stress Management; See AVS  Encouraged continued use of ovulation testing strips, well timed intercourse and elevating legs for 30 minutes after intercourse.  RTC x 3 months to discuss infertility management, if not pregnant  RTC x 1 year for Annual Exam or sooner if needed   Gunnar Bulla, CNM [facility], [facility]

## 2018-05-14 LAB — CYTOLOGY - PAP
Diagnosis: NEGATIVE
HPV (WINDOPATH): NOT DETECTED

## 2018-05-15 LAB — CBC
HEMOGLOBIN: 11.9 g/dL (ref 11.1–15.9)
Hematocrit: 35.2 % (ref 34.0–46.6)
MCH: 29.7 pg (ref 26.6–33.0)
MCHC: 33.8 g/dL (ref 31.5–35.7)
MCV: 88 fL (ref 79–97)
Platelets: 233 10*3/uL (ref 150–450)
RBC: 4.01 x10E6/uL (ref 3.77–5.28)
RDW: 12.8 % (ref 12.3–15.4)
WBC: 6.4 10*3/uL (ref 3.4–10.8)

## 2018-05-15 LAB — HEMOGLOBIN A1C
ESTIMATED AVERAGE GLUCOSE: 108 mg/dL
HEMOGLOBIN A1C: 5.4 % (ref 4.8–5.6)

## 2018-05-15 LAB — FSH/LH
FSH: 4.7 m[IU]/mL
LH: 7 m[IU]/mL

## 2018-05-15 LAB — COMPREHENSIVE METABOLIC PANEL
ALBUMIN: 4 g/dL (ref 3.5–5.5)
ALK PHOS: 42 IU/L (ref 39–117)
ALT: 17 IU/L (ref 0–32)
AST: 20 IU/L (ref 0–40)
Albumin/Globulin Ratio: 1.5 (ref 1.2–2.2)
BILIRUBIN TOTAL: 0.6 mg/dL (ref 0.0–1.2)
BUN / CREAT RATIO: 11 (ref 9–23)
BUN: 8 mg/dL (ref 6–20)
CHLORIDE: 105 mmol/L (ref 96–106)
CO2: 23 mmol/L (ref 20–29)
CREATININE: 0.75 mg/dL (ref 0.57–1.00)
Calcium: 9 mg/dL (ref 8.7–10.2)
GFR calc Af Amer: 117 mL/min/{1.73_m2} (ref >59)
GFR calc non Af Amer: 101 mL/min/{1.73_m2} (ref >59)
GLUCOSE: 81 mg/dL (ref 65–99)
Globulin, Total: 2.7 g/dL (ref 1.5–4.5)
Potassium: 4.1 mmol/L (ref 3.5–5.2)
SODIUM: 141 mmol/L (ref 134–144)
Total Protein: 6.7 g/dL (ref 6.0–8.5)

## 2018-05-15 LAB — LIPID PANEL
CHOLESTEROL TOTAL: 156 mg/dL (ref 100–199)
Chol/HDL Ratio: 3.1 ratio (ref 0.0–4.4)
HDL: 51 mg/dL (ref >39)
LDL Calculated: 97 mg/dL (ref 0–99)
TRIGLYCERIDES: 40 mg/dL (ref 0–149)
VLDL CHOLESTEROL CAL: 8 mg/dL (ref 5–40)

## 2018-05-15 LAB — THYROID PANEL WITH TSH
FREE THYROXINE INDEX: 2 (ref 1.2–4.9)
T3 UPTAKE RATIO: 28 % (ref 24–39)
T4, Total: 7 ug/dL (ref 4.5–12.0)
TSH: 2.35 u[IU]/mL (ref 0.450–4.500)

## 2018-05-15 LAB — ANTI MULLERIAN HORMONE: ANTI-MULLERIAN HORMONE (AMH): 2.81 ng/mL

## 2018-05-15 LAB — BETA HCG QUANT (REF LAB): hCG Quant: <1 m[IU]/mL

## 2018-05-15 LAB — PROGESTERONE: PROGESTERONE: 8.2 ng/mL

## 2018-05-16 NOTE — Progress Notes (Signed)
Labs normal. Not pregnant at this time. Pap normal/HPV negative. Encourage to activate MyChart. Follow up as needed. JML

## 2018-05-17 ENCOUNTER — Telehealth: Payer: Self-pay

## 2018-05-17 NOTE — Telephone Encounter (Signed)
Pt informed of neg results per ML.

## 2018-07-04 ENCOUNTER — Telehealth: Payer: Self-pay | Admitting: Certified Nurse Midwife

## 2018-07-04 ENCOUNTER — Ambulatory Visit: Payer: BC Managed Care – PPO | Admitting: Certified Nurse Midwife

## 2018-07-04 VITALS — BP 129/68 | HR 72 | Ht 65.0 in | Wt 212.2 lb

## 2018-07-04 DIAGNOSIS — Z3169 Encounter for other general counseling and advice on procreation: Secondary | ICD-10-CM

## 2018-07-04 NOTE — Progress Notes (Deleted)
ref

## 2018-07-04 NOTE — Telephone Encounter (Signed)
He patient is unable to schedule an apt at the infertility specialist until her records are sent out. Please advise.

## 2018-07-05 ENCOUNTER — Telehealth: Payer: Self-pay

## 2018-07-05 ENCOUNTER — Encounter: Payer: Self-pay | Admitting: Certified Nurse Midwife

## 2018-07-05 NOTE — Telephone Encounter (Signed)
Advise patient, referral was placed yesterday and records will be sent. Thanks, JML

## 2018-07-05 NOTE — Telephone Encounter (Signed)
mychart message sent- referral sent, medical records to follow.

## 2018-07-07 NOTE — Progress Notes (Signed)
GYN ENCOUNTER NOTE  Subjective:       Mary Morton is a 37 y.o. G36P2002 female here for follow up from Annual Exam regarding infertility.   Reports husband results from Urology noted decreased motility of sperm. Previous pregnancy achieved six (6) months after IUI. No success in last few months.   Denies difficulty breathing or respiratory distress, chest pain, abdominal pain, excessive vaginal bleeding, dysuria, and leg pain.     Gynecologic History  Patient's last menstrual period was 06/12/2018 (exact date).   Contraception: none  Last Pap: 05/2018. Results were: Negative/Negative  Obstetric History  OB History  Gravida Para Term Preterm AB Living  3 2 2  0 0 2  SAB TAB Ectopic Multiple Live Births  0 0 0 0 2    # Outcome Date GA Lbr Len/2nd Weight Sex Delivery Anes PTL Lv  3 Term 07/10/11 [redacted]w[redacted]d  6 lb 7.7 oz (2.94 kg) M CS-LTranv Spinal  LIV  2 Term 03/01/10 [redacted]w[redacted]d 09:00 5 lb (2.268 kg) F CS-Unspec   LIV  1 Gravida              Birth Comments: System Generated. Please review and update pregnancy details.    Past Medical History:  Diagnosis Date  . Anemia   . GERD (gastroesophageal reflux disease)     Past Surgical History:  Procedure Laterality Date  . CESAREAN SECTION  2011  . CESAREAN SECTION  07/10/2011   Procedure: CESAREAN SECTION;  Surgeon: [name], MD;  Location: [facility];  Service: Gynecology;  Laterality: N/A;  . CHOLECYSTECTOMY  2012    Current Outpatient Medications on File Prior to Visit  Medication Sig Dispense Refill  . Prenatal Vit-Fe Fumarate-FA (PRENATAL MULTIVITAMIN) TABS tablet Take 1 tablet by mouth daily at 12 noon.     No current facility-administered medications on file prior to visit.     Allergies  Allergen Reactions  . Latex Hives    Social History   Socioeconomic History  . Marital status: Married    Spouse name: Not on file  . Number of children: Not on file  . Years of education: Not on file  . Highest education  level: Not on file  Occupational History  . Not on file  Social Needs  . Financial resource strain: Not on file  . Food insecurity:    Worry: Not on file    Inability: Not on file  . Transportation needs:    Medical: Not on file    Non-medical: Not on file  Tobacco Use  . Smoking status: Never Smoker  . Smokeless tobacco: Never Used  Substance and Sexual Activity  . Alcohol use: No  . Drug use: No  . Sexual activity: Yes  Lifestyle  . Physical activity:    Days per week: Not on file    Minutes per session: Not on file  . Stress: Not on file  Relationships  . Social connections:    Talks on phone: Not on file    Gets together: Not on file    Attends religious service: Not on file    Active member of club or organization: Not on file    Attends meetings of clubs or organizations: Not on file    Relationship status: Not on file  . Intimate partner violence:    Fear of current or ex partner: Not on file    Emotionally abused: Not on file    Physically abused: Not on file    Forced  sexual activity: Not on file  Other Topics Concern  . Not on file  Social History Narrative  . Not on file    Family History  Problem Relation Age of Onset  . Hypertension Maternal Grandmother   . Hypertension Maternal Grandfather   . Hypertension Paternal Grandmother   . Hypertension Paternal Grandfather   . Diabetes Paternal Grandfather   . Cancer Paternal Grandfather     The following portions of the patient's history were reviewed and updated as appropriate: allergies, current medications, past family history, past medical history, past social history, past surgical history and problem list.  Review of Systems  ROS negative except as noted above. Information obtained from patient.   Objective:   BP 129/68   Pulse 72   Ht 5\' 5"  (1.651 m)   Wt 212 lb 3 oz (96.2 kg)   LMP 06/12/2018 (Exact Date)   Breastfeeding? No   BMI 35.31 kg/m    CONSTITUTIONAL: Well-developed,  well-nourished female in no acute distress.   Recent Results (from the past 2160 hour(s))  Cytology - PAP     Status: None   Collection Time: 05/10/18 12:00 AM  Result Value Ref Range   Adequacy      Satisfactory for evaluation  endocervical/transformation zone component PRESENT.   Diagnosis      NEGATIVE FOR INTRAEPITHELIAL LESIONS OR MALIGNANCY.   HPV NOT DETECTED     Comment: Normal Reference Range - NOT Detected   Material Submitted CervicoVaginal Pap [ThinPrep Imaged]    CYTOLOGY - PAP PAP RESULT   Thyroid Panel With TSH     Status: None   Collection Time: 05/10/18 11:39 AM  Result Value Ref Range   TSH 2.350 0.450 - 4.500 uIU/mL   T4, Total 7.0 4.5 - 12.0 ug/dL   T3 Uptake Ratio 28 24 - 39 %   Free Thyroxine Index 2.0 1.2 - 4.9  CBC     Status: None   Collection Time: 05/10/18 11:39 AM  Result Value Ref Range   WBC 6.4 3.4 - 10.8 x10E3/uL   RBC 4.01 3.77 - 5.28 x10E6/uL   Hemoglobin 11.9 11.1 - 15.9 g/dL   Hematocrit 16.1 09.6 - 46.6 %   MCV 88 79 - 97 fL   MCH 29.7 26.6 - 33.0 pg   MCHC 33.8 31.5 - 35.7 g/dL   RDW 04.5 40.9 - 81.1 %   Platelets 233 150 - 450 x10E3/uL  Comprehensive metabolic panel     Status: None   Collection Time: 05/10/18 11:39 AM  Result Value Ref Range   Glucose 81 65 - 99 mg/dL   BUN 8 6 - 20 mg/dL   Creatinine, Ser 9.14 0.57 - 1.00 mg/dL   GFR calc non Af Amer 101 >59 mL/min/1.73   GFR calc Af Amer 117 >59 mL/min/1.73   BUN/Creatinine Ratio 11 9 - 23   Sodium 141 134 - 144 mmol/L   Potassium 4.1 3.5 - 5.2 mmol/L   Chloride 105 96 - 106 mmol/L   CO2 23 20 - 29 mmol/L   Calcium 9.0 8.7 - 10.2 mg/dL   Total Protein 6.7 6.0 - 8.5 g/dL   Albumin 4.0 3.5 - 5.5 g/dL   Globulin, Total 2.7 1.5 - 4.5 g/dL   Albumin/Globulin Ratio 1.5 1.2 - 2.2   Bilirubin Total 0.6 0.0 - 1.2 mg/dL   Alkaline Phosphatase 42 39 - 117 IU/L   AST 20 0 - 40 IU/L   ALT 17 0 - 32 IU/L  Lipid panel     Status: None   Collection Time: 05/10/18 11:39 AM  Result  Value Ref Range   Cholesterol, Total 156 100 - 199 mg/dL   Triglycerides 40 0 - 149 mg/dL   HDL 51 >16 mg/dL   VLDL Cholesterol Cal 8 5 - 40 mg/dL   LDL Calculated 97 0 - 99 mg/dL   Chol/HDL Ratio 3.1 0.0 - 4.4 ratio    Comment:                                   T. Chol/HDL Ratio                                             Men  Women                               1/2 Avg.Risk  3.4    3.3                                   Avg.Risk  5.0    4.4                                2X Avg.Risk  9.6    7.1                                3X Avg.Risk 23.4   11.0   Beta HCG, Quant     Status: None   Collection Time: 05/10/18 11:39 AM  Result Value Ref Range   hCG Quant <1 mIU/mL    Comment:                      Female (Non-pregnant)    0 -     5                             (Postmenopausal)  0 -     8                      Female (Pregnant)                      Weeks of Gestation                              3                6 -    71                              4               10 -   750                              5  217 -  7138                              6              158 - 31795                              7             [id number]                              8            32065 -[id number]                              9            63803 -[id number]                             10            46509 -[phone]                             12            27832 -[phone]                             14            13950 - 62530                             15            12039 - 70971                             16             9040 - 56451                             17             [phone] - [phone] Roche E CLIA methodology   Anti mullerian hormone     Status: None   Collection Time: 05/10/18 11:39 AM  Result Value Ref Range   ANTI-MULLERIAN HORMONE (AMH) 2.81 ng/mL    Comment: For assays employing antibodies, the possibility exists  for interference by heterophile antibodies in the samples.1 1. Kricka L.  Interferences in Immunoassays - still a threat.  Clin. Chem. 2000; 46: [phone]. Reference Range: Females 36 - 40y: 0.42 - 8.34 Median  1.69 AMH concentrations of >= 1.06 ng/mL is correlated with a better response to ovarian stimulation, produced more retrievable oocytes and higher odds of live birth according to Gleicher et al.  Garlon Hatchet and Sterility. 2010:  [phone].  The current AMH test method correlates with the study method with a slope of 0.94. Females at risk of ovarian hyperstimulation syndrome or polycystic ovarian syndrome (PCOS) may exhibit elevated serum AMH concentrations.   AMH levels from PCOS patients may be 2 to 5 fold higher than age-appropriate reference interval values. Granulosa cell tumors of the ovary may secrete AMH along with other tumor markers.  Elevated AMH is not specific for malignancy, and the assay s hould not be used exclusively to diagnose or exclude an AMH-secreting ovarian tumor.   FSH/LH     Status: None   Collection Time: 05/10/18 11:39 AM  Result Value Ref Range   LH 7.0 mIU/mL    Comment:                     Adult Female:                       Follicular phase      2.4 -  12.6                       Ovulation phase      14.0 -  95.6                       Luteal phase          1.0 -  11.4                       Postmenopausal        7.7 -  58.5    FSH 4.7 mIU/mL    Comment:                     Adult Female:                       Follicular phase      3.5 -  12.5                       Ovulation phase       4.7 -  21.5                       Luteal phase          1.7 -   7.7                       Postmenopausal       25.8 - 134.8   Hemoglobin A1c     Status: None   Collection Time: 05/10/18 11:39 AM  Result Value Ref Range   Hgb A1c MFr Bld 5.4 4.8 - 5.6 %    Comment:          Prediabetes: 5.7 - 6.4          Diabetes: >6.4          Glycemic control for  adults with diabetes: <7.0    Est. average glucose Bld gHb Est-mCnc 108 mg/dL  Progesterone     Status: None   Collection Time: 05/10/18 11:39 AM  Result Value Ref Range   Progesterone 8.2 ng/mL    Comment:                      Follicular phase       0.1 -  0.9                      Luteal phase           1.8 -  23.9                      Ovulation phase        0.1 -  12.0                      Pregnant                         First trimester    11.0 -  44.3                         Second trimester   25.4 -  83.3                         Third trimester    58.7 - 214.0                      Postmenopausal         0.0 -   0.1     Assessment:   1. Encounter for infertility counseling  - Ambulatory referral to Infertility  Plan:   Lab results reviewed with patient, verbalized understanding.   Declines beta today.   Referral to [facility], see orders.   Reviewed red flag symptoms and when to call.   RTC as needed.    Gunnar Bulla, CNM [facility], [facility]

## 2018-07-10 ENCOUNTER — Encounter: Payer: Self-pay | Admitting: Certified Nurse Midwife

## 2021-11-11 ENCOUNTER — Other Ambulatory Visit: Payer: Self-pay | Admitting: Internal Medicine

## 2021-11-11 DIAGNOSIS — Z1231 Encounter for screening mammogram for malignant neoplasm of breast: Secondary | ICD-10-CM

## 2021-12-20 ENCOUNTER — Other Ambulatory Visit: Payer: Self-pay

## 2021-12-20 ENCOUNTER — Ambulatory Visit
Admission: RE | Admit: 2021-12-20 | Discharge: 2021-12-20 | Disposition: A | Discharge status: Home / Self Care | Payer: 59 | Source: Ambulatory Visit | Attending: Internal Medicine | Admitting: Internal Medicine

## 2021-12-20 DIAGNOSIS — Z1231 Encounter for screening mammogram for malignant neoplasm of breast: Secondary | ICD-10-CM | POA: Diagnosis present

## 2021-12-22 ENCOUNTER — Other Ambulatory Visit: Payer: Self-pay | Admitting: Internal Medicine

## 2021-12-22 DIAGNOSIS — R928 Other abnormal and inconclusive findings on diagnostic imaging of breast: Secondary | ICD-10-CM

## 2021-12-22 DIAGNOSIS — R921 Mammographic calcification found on diagnostic imaging of breast: Secondary | ICD-10-CM

## 2022-01-04 ENCOUNTER — Ambulatory Visit
Admission: RE | Admit: 2022-01-04 | Discharge: 2022-01-04 | Disposition: A | Discharge status: Home / Self Care | Payer: 59 | Source: Ambulatory Visit | Attending: Internal Medicine | Admitting: Internal Medicine

## 2022-01-04 DIAGNOSIS — R928 Other abnormal and inconclusive findings on diagnostic imaging of breast: Secondary | ICD-10-CM | POA: Diagnosis present

## 2022-01-04 DIAGNOSIS — R921 Mammographic calcification found on diagnostic imaging of breast: Secondary | ICD-10-CM | POA: Insufficient documentation

## 2022-01-11 ENCOUNTER — Other Ambulatory Visit: Payer: Self-pay | Admitting: Internal Medicine

## 2022-01-11 DIAGNOSIS — R928 Other abnormal and inconclusive findings on diagnostic imaging of breast: Secondary | ICD-10-CM

## 2022-01-11 DIAGNOSIS — R921 Mammographic calcification found on diagnostic imaging of breast: Secondary | ICD-10-CM

## 2022-01-26 ENCOUNTER — Ambulatory Visit
Admission: RE | Admit: 2022-01-26 | Discharge: 2022-01-26 | Disposition: A | Discharge status: Home / Self Care | Payer: 59 | Source: Ambulatory Visit | Attending: Internal Medicine | Admitting: Internal Medicine

## 2022-01-26 ENCOUNTER — Other Ambulatory Visit: Payer: Self-pay | Admitting: Internal Medicine

## 2022-01-26 DIAGNOSIS — R928 Other abnormal and inconclusive findings on diagnostic imaging of breast: Secondary | ICD-10-CM

## 2022-01-26 DIAGNOSIS — R921 Mammographic calcification found on diagnostic imaging of breast: Secondary | ICD-10-CM

## 2022-01-26 DIAGNOSIS — N62 Hypertrophy of breast: Secondary | ICD-10-CM | POA: Insufficient documentation

## 2022-01-26 DIAGNOSIS — N649 Disorder of breast, unspecified: Secondary | ICD-10-CM | POA: Insufficient documentation

## 2022-01-26 HISTORY — PX: BREAST BIOPSY: SHX20

## 2022-01-27 LAB — SURGICAL PATHOLOGY

## 2022-02-06 ENCOUNTER — Ambulatory Visit: Payer: Self-pay | Admitting: Surgery

## 2022-02-06 NOTE — H&P (View-Only) (Signed)
Subjective: ?CC: Perianal venous thrombosis [K64.5]  ? ?HPI: ? Mary Morton is a 41 y.o. female who was referrred by Glenda Lynn Fields, NP for above. Symptoms were first noted 1 month ago. she has some rectal bleeding occurring a few times per week. Bleeding is described as scant. Pain is dull and intermittent, confined to the perianal area, without radiation.  Associated with nothing, exacerbated by nothing specific. ? ?Past Medical History:  has a past medical history of Bradycardia and Hypertension. ? ?Past Surgical History:  has a past surgical history that includes Cholecystectomy and Cesarean section. ? ?Family History: reviewed and not relevant to CC ? ?Social History:  reports that she has never smoked. She has never used smokeless tobacco. She reports that she does not currently use alcohol. She reports that she does not use drugs. ? ?Current Medications: has a current medication list which includes the following prescription(s): hydrocortisone, nitroglycerin, and semaglutide. ? ?Allergies:  ?Allergies as of 02/06/2022 - Reviewed 02/06/2022 ?Allergen Reaction Noted ? Latex Hives 04/12/2011 ? ? ?ROS:  ?A 15 point review of systems was performed and pertinent positives and negatives noted in HPI ? ?Objective: ?  ? ?BP (!) 135/90   Pulse 70   Ht 165.1 cm (5' 5")   Wt (!) 105.6 kg (232 lb 12.8 oz)   LMP 01/09/2022   BMI 38.74 kg/m?  ? ?Constitutional :  No distress, cooperative, alert ?Lymphatics/Throat:  Supple with no lymphadenopathy ?Respiratory:  Clear to auscultation bilaterally ?Cardiovascular:  Regular rate and rhythm ?Gastrointestinal: Soft, non-tender, non-distended, no organomegaly. ?Musculoskeletal: Steady gait and movement ?Skin: Cool and moist, no surgical scars ?Psychiatric: Normal affect, non-agitated, not confused ?Rectal: Chaperone present for exam.  External exam noted to have left lateral external hemorrhoids, no TTP, sign of previous ulceration. DRE revealed, normal rectal tone,  with palpable internal hemorrhoid noted deep to external hemorrhoid with minimal discomfort.  After obtaining verbal consent, an anoscope was inserted after prepped with lubricant and internal hemorrhoid noted on DRE confirmed visually, with no evidence of thrombosis. No other pathology such as fissures, fistulas, polyps noted.  Scope withdrawn and patient tolerate procedure well. ? ?  ?  ?LABS:  ?n/a  ? ?RADS: ?n/a ?Assessment: ? ?  ?Perianal venous thrombosis [K64.5]  ?Third degree hemorrhoids.  Persistent and due to external component, recommended formal hemorrhoidectomy in OR. ? ?Plan: ?  ?1. Perianal venous thrombosis [K64.5] Discussed risks/benefits/alternatives to surgery.  Alternatives include the options of observation, medical management.  Benefits include symptomatic relief.  I discussed  in detail and the complications related to the operation and the anesthesia, including bleeding, infection, recurrence, remote possibility of temporary or permanent fecal incontinence, poor/delayed wound healing, chronic pain, and additional procedures to address said risks. The risks of general anesthetic, if used, includes MI, CVA, sudden death or even reaction to anesthetic medications also discussed.   ?We also discussed typical post operative recovery which includes weeks to potentially months of anal pain, drainage, occasional bleeding, and sense of fecal urgency.   ? ?ED return precautions given for sudden increase in pain, bleeding, with possible accompanying fever, nausea, and/or vomiting. ? ?The patient understands the risks, any and all questions were answered to the patient's satisfaction. ? ?2. Patient has elected to proceed with surgical treatment. Procedure will be scheduled. ? ?labs/images/medications/previous chart entries reviewed personally and relevant changes/updates noted above. ? ?

## 2022-02-06 NOTE — H&P (Signed)
Subjective: ?CC: Perianal venous thrombosis [K64.5]  ? ?HPI: ? Mary Morton is a 41 y.o. female who was referrred by Areta Haber, NP for above. Symptoms were first noted 1 month ago. she has some rectal bleeding occurring a few times per week. Bleeding is described as scant. Pain is dull and intermittent, confined to the perianal area, without radiation.  Associated with nothing, exacerbated by nothing specific. ? ?Past Medical History:  has a past medical history of Bradycardia and Hypertension. ? ?Past Surgical History:  has a past surgical history that includes Cholecystectomy and Cesarean section. ? ?Family History: reviewed and not relevant to CC ? ?Social History:  reports that she has never smoked. She has never used smokeless tobacco. She reports that she does not currently use alcohol. She reports that she does not use drugs. ? ?Current Medications: has a current medication list which includes the following prescription(s): hydrocortisone, nitroglycerin, and semaglutide. ? ?Allergies:  ?Allergies as of 02/06/2022 - Reviewed 02/06/2022 ?Allergen Reaction Noted ? Latex Hives 04/12/2011 ? ? ?ROS:  ?A 15 point review of systems was performed and pertinent positives and negatives noted in HPI ? ?Objective: ?  ? ?BP (!) 135/90   Pulse 70   Ht 165.1 cm (5\' 5" )   Wt (!) 105.6 kg (232 lb 12.8 oz)   LMP 01/09/2022   BMI 38.74 kg/m?  ? ?Constitutional :  No distress, cooperative, alert ?Lymphatics/Throat:  Supple with no lymphadenopathy ?Respiratory:  Clear to auscultation bilaterally ?Cardiovascular:  Regular rate and rhythm ?Gastrointestinal: Soft, non-tender, non-distended, no organomegaly. ?Musculoskeletal: Steady gait and movement ?Skin: Cool and moist, no surgical scars ?Psychiatric: Normal affect, non-agitated, not confused ?Rectal: Chaperone present for exam.  External exam noted to have left lateral external hemorrhoids, no TTP, sign of previous ulceration. DRE revealed, normal rectal tone,  with palpable internal hemorrhoid noted deep to external hemorrhoid with minimal discomfort.  After obtaining verbal consent, an anoscope was inserted after prepped with lubricant and internal hemorrhoid noted on DRE confirmed visually, with no evidence of thrombosis. No other pathology such as fissures, fistulas, polyps noted.  Scope withdrawn and patient tolerate procedure well. ? ?  ?  ?LABS:  ?n/a  ? ?RADS: ?n/a ?Assessment: ? ?  ?Perianal venous thrombosis [K64.5]  ?Third degree hemorrhoids.  Persistent and due to external component, recommended formal hemorrhoidectomy in OR. ? ?Plan: ?  ?1. Perianal venous thrombosis [K64.5] Discussed risks/benefits/alternatives to surgery.  Alternatives include the options of observation, medical management.  Benefits include symptomatic relief.  I discussed  in detail and the complications related to the operation and the anesthesia, including bleeding, infection, recurrence, remote possibility of temporary or permanent fecal incontinence, poor/delayed wound healing, chronic pain, and additional procedures to address said risks. The risks of general anesthetic, if used, includes MI, CVA, sudden death or even reaction to anesthetic medications also discussed.   ?We also discussed typical post operative recovery which includes weeks to potentially months of anal pain, drainage, occasional bleeding, and sense of fecal urgency.   ? ?ED return precautions given for sudden increase in pain, bleeding, with possible accompanying fever, nausea, and/or vomiting. ? ?The patient understands the risks, any and all questions were answered to the patient's satisfaction. ? ?2. Patient has elected to proceed with surgical treatment. Procedure will be scheduled. ? ?labs/images/medications/previous chart entries reviewed personally and relevant changes/updates noted above. ? ?

## 2022-02-10 ENCOUNTER — Encounter
Admission: RE | Admit: 2022-02-10 | Discharge: 2022-02-10 | Disposition: A | Discharge status: Home / Self Care | Payer: 59 | Source: Ambulatory Visit | Attending: Surgery | Admitting: Surgery

## 2022-02-10 VITALS — Ht 65.0 in | Wt 220.0 lb

## 2022-02-10 DIAGNOSIS — Z01812 Encounter for preprocedural laboratory examination: Secondary | ICD-10-CM

## 2022-02-10 NOTE — Patient Instructions (Addendum)
Your procedure is scheduled on: Friday, May 19 ?Report to the Registration Desk on the 1st floor of the [facility]. ?To find out your arrival time, please call [phone] between 1PM - 3PM on: Thursday, May 18 ?If your arrival time is 6:00 am, do not arrive prior to that time as the [facility] entrance doors do not open until 6:00 am. ? ?REMEMBER: ?Instructions that are not followed completely may result in serious medical risk, up to and including death; or upon the discretion of your surgeon and anesthesiologist your surgery may need to be rescheduled. ? ?Do not eat food after midnight the night before surgery.  ?No gum chewing, lozengers or hard candies. ? ?You may however, drink CLEAR liquids up to 2 hours before you are scheduled to arrive for your surgery. Do not drink anything within 2 hours of your scheduled arrival time. ? ?Clear liquids include: ?- water  ?- apple juice without pulp ?- gatorade (not RED colors) ?- black coffee or tea (Do NOT add milk or creamers to the coffee or tea) ?Do NOT drink anything that is not on this list. ? ?DO NOT TAKE ANY MEDICATIONS THE MORNING OF SURGERY  ? ?One week prior to surgery: starting May 12 ?Stop Anti-inflammatories (NSAIDS) such as Advil, Aleve, Ibuprofen, Motrin, Naproxen, Naprosyn and Aspirin based products such as Excedrin, Goodys Powder, BC Powder. ?Stop ANY OVER THE COUNTER supplements until after surgery. ?You may however, continue to take Tylenol if needed for pain up until the day of surgery. ? ?No Alcohol for 24 hours before or after surgery. ? ?No Smoking including e-cigarettes for 24 hours prior to surgery.  ?No chewable tobacco products for at least 6 hours prior to surgery.  ?No nicotine patches on the day of surgery. ? ?Do not use any "recreational" drugs for at least a week prior to your surgery.  ?Please be advised that the combination of cocaine and anesthesia may have negative outcomes, up to and including death. ?If you test positive for  cocaine, your surgery will be cancelled. ? ?On the morning of surgery brush your teeth with toothpaste and water, you may rinse your mouth with mouthwash if you wish. ?Do not swallow any toothpaste or mouthwash. ? ?Do not wear jewelry, make-up, hairpins, clips or nail polish. ? ?Do not wear lotions, powders, or perfumes.  ? ?Do not shave body from the neck down 48 hours prior to surgery just in case you cut yourself which could leave a site for infection.  ? ?Contact lenses, hearing aids and dentures may not be worn into surgery. ? ?Do not bring valuables to the hospital. [facility] is not responsible for any missing/lost belongings or valuables.  ? ?Notify your doctor if there is any change in your medical condition (cold, fever, infection). ? ?Wear comfortable clothing (specific to your surgery type) to the hospital. ? ?After surgery, you can help prevent lung complications by doing breathing exercises.  ?Take deep breaths and cough every 1-2 hours. Your doctor may order a device called an Incentive Spirometer to help you take deep breaths. ? ?If you are being discharged the day of surgery, you will not be allowed to drive home. ?You will need a responsible adult (18 years or older) to drive you home and stay with you that night.  ? ?If you are taking public transportation, you will need to have a responsible adult (18 years or older) with you. ?Please confirm with your physician that it is acceptable to  use public transportation.  ? ?Please call the [facility] at [phone] if you have any questions about these instructions. ? ?Surgery Visitation Policy: ? ?Patients undergoing a surgery or procedure may have two family members or support persons with them as long as the person is not COVID-19 positive or experiencing its symptoms.  ?

## 2022-02-17 ENCOUNTER — Other Ambulatory Visit: Payer: Self-pay

## 2022-02-17 ENCOUNTER — Ambulatory Visit: Payer: 59 | Admitting: Certified Registered"

## 2022-02-17 ENCOUNTER — Encounter: Payer: Self-pay | Admitting: Surgery

## 2022-02-17 ENCOUNTER — Encounter
Admission: RE | Disposition: A | Discharge status: Home / Self Care | Payer: Self-pay | Source: Ambulatory Visit | Attending: Surgery

## 2022-02-17 ENCOUNTER — Ambulatory Visit
Admission: RE | Admit: 2022-02-17 | Discharge: 2022-02-17 | Disposition: A | Discharge status: Home / Self Care | Payer: 59 | Source: Ambulatory Visit | Attending: Surgery | Admitting: Surgery

## 2022-02-17 DIAGNOSIS — Z01812 Encounter for preprocedural laboratory examination: Secondary | ICD-10-CM

## 2022-02-17 DIAGNOSIS — K645 Perianal venous thrombosis: Secondary | ICD-10-CM | POA: Diagnosis present

## 2022-02-17 HISTORY — PX: HEMORRHOID SURGERY: SHX153

## 2022-02-17 LAB — POCT PREGNANCY, URINE: Preg Test, Ur: NEGATIVE

## 2022-02-17 SURGERY — HEMORRHOIDECTOMY
Anesthesia: General | Site: Rectum

## 2022-02-17 MED ORDER — FAMOTIDINE 20 MG PO TABS
ORAL_TABLET | ORAL | Status: AC
  Administered 2022-02-17: 20 mg via ORAL
  Filled 2022-02-17: qty 1

## 2022-02-17 MED ORDER — ACETAMINOPHEN 500 MG PO TABS: 1000.0000 mg | ORAL_TABLET | ORAL | Status: AC

## 2022-02-17 MED ORDER — DEXAMETHASONE SODIUM PHOSPHATE 10 MG/ML IJ SOLN
INTRAMUSCULAR | Status: AC
  Filled 2022-02-17: qty 1

## 2022-02-17 MED ORDER — CELECOXIB 200 MG PO CAPS: 200.0000 mg | ORAL_CAPSULE | ORAL | Status: AC

## 2022-02-17 MED ORDER — PROPOFOL 10 MG/ML IV BOLUS
INTRAVENOUS | Status: AC
  Filled 2022-02-17: qty 20

## 2022-02-17 MED ORDER — ACETAMINOPHEN 500 MG PO TABS
ORAL_TABLET | ORAL | Status: AC
  Administered 2022-02-17: 1000 mg via ORAL
  Filled 2022-02-17: qty 2

## 2022-02-17 MED ORDER — ONDANSETRON HCL 4 MG/2ML IJ SOLN
INTRAMUSCULAR | Status: AC
Start: 2022-02-17 — End: ?
  Filled 2022-02-17: qty 2

## 2022-02-17 MED ORDER — CELECOXIB 200 MG PO CAPS
ORAL_CAPSULE | ORAL | Status: AC
  Administered 2022-02-17: 200 mg via ORAL
  Filled 2022-02-17: qty 1

## 2022-02-17 MED ORDER — GABAPENTIN 300 MG PO CAPS: 300.0000 mg | ORAL_CAPSULE | ORAL | Status: AC

## 2022-02-17 MED ORDER — PROPOFOL 10 MG/ML IV BOLUS
INTRAVENOUS | Status: DC | PRN
  Administered 2022-02-17: 200 mg via INTRAVENOUS

## 2022-02-17 MED ORDER — BUPIVACAINE LIPOSOME 1.3 % IJ SUSP
INTRAMUSCULAR | Status: AC
Start: 2022-02-17 — End: ?
  Filled 2022-02-17: qty 20

## 2022-02-17 MED ORDER — DOCUSATE SODIUM 100 MG PO CAPS: 100.0000 mg | ORAL_CAPSULE | Freq: Two times a day (BID) | ORAL | 0 refills | Status: AC | PRN

## 2022-02-17 MED ORDER — GABAPENTIN 300 MG PO CAPS
ORAL_CAPSULE | ORAL | Status: AC
  Administered 2022-02-17: 300 mg via ORAL
  Filled 2022-02-17: qty 1

## 2022-02-17 MED ORDER — MIDAZOLAM HCL 2 MG/2ML IJ SOLN
INTRAMUSCULAR | Status: AC
  Filled 2022-02-17: qty 2

## 2022-02-17 MED ORDER — ACETAMINOPHEN 325 MG PO TABS: 650.0000 mg | ORAL_TABLET | Freq: Three times a day (TID) | ORAL | 0 refills | Status: AC | PRN

## 2022-02-17 MED ORDER — CHLORHEXIDINE GLUCONATE 0.12 % MT SOLN
OROMUCOSAL | Status: AC
  Administered 2022-02-17: 15 mL via OROMUCOSAL
  Filled 2022-02-17: qty 15

## 2022-02-17 MED ORDER — BUPIVACAINE-EPINEPHRINE (PF) 0.5% -1:200000 IJ SOLN
INTRAMUSCULAR | Status: AC
  Filled 2022-02-17: qty 30

## 2022-02-17 MED ORDER — GELATIN ABSORBABLE 12-7 MM EX MISC
CUTANEOUS | Status: AC
  Filled 2022-02-17: qty 1

## 2022-02-17 MED ORDER — LIDOCAINE HCL (CARDIAC) PF 100 MG/5ML IV SOSY
PREFILLED_SYRINGE | INTRAVENOUS | Status: DC | PRN
  Administered 2022-02-17: 100 mg via INTRAVENOUS

## 2022-02-17 MED ORDER — PROMETHAZINE HCL 25 MG/ML IJ SOLN: 6.2500 mg | INTRAMUSCULAR | Status: DC | PRN

## 2022-02-17 MED ORDER — FENTANYL CITRATE (PF) 100 MCG/2ML IJ SOLN
INTRAMUSCULAR | Status: DC | PRN
  Administered 2022-02-17 (×2): 25 ug via INTRAVENOUS
  Administered 2022-02-17: 50 ug via INTRAVENOUS

## 2022-02-17 MED ORDER — HYDROCODONE-ACETAMINOPHEN 5-325 MG PO TABS: 1.0000 | ORAL_TABLET | Freq: Four times a day (QID) | ORAL | 0 refills | Status: AC | PRN

## 2022-02-17 MED ORDER — IBUPROFEN 800 MG PO TABS: 800.0000 mg | ORAL_TABLET | Freq: Three times a day (TID) | ORAL | 0 refills | Status: AC | PRN

## 2022-02-17 MED ORDER — LACTATED RINGERS IV SOLN: INTRAVENOUS | Status: DC

## 2022-02-17 MED ORDER — CHLORHEXIDINE GLUCONATE 0.12 % MT SOLN: 15.0000 mL | Freq: Once | OROMUCOSAL | Status: AC

## 2022-02-17 MED ORDER — LIDOCAINE 5 % EX OINT: 1.0000 "application " | TOPICAL_OINTMENT | Freq: Three times a day (TID) | CUTANEOUS | 0 refills | Status: AC | PRN

## 2022-02-17 MED ORDER — BUPIVACAINE-EPINEPHRINE (PF) 0.5% -1:200000 IJ SOLN
INTRAMUSCULAR | Status: DC | PRN
  Administered 2022-02-17: 20 mL

## 2022-02-17 MED ORDER — CHLORHEXIDINE GLUCONATE CLOTH 2 % EX PADS: 6.0000 | MEDICATED_PAD | Freq: Once | CUTANEOUS | Status: DC

## 2022-02-17 MED ORDER — ORAL CARE MOUTH RINSE: 15.0000 mL | Freq: Once | OROMUCOSAL | Status: AC

## 2022-02-17 MED ORDER — FENTANYL CITRATE (PF) 100 MCG/2ML IJ SOLN: 25.0000 ug | INTRAMUSCULAR | Status: DC | PRN

## 2022-02-17 MED ORDER — FENTANYL CITRATE (PF) 100 MCG/2ML IJ SOLN
INTRAMUSCULAR | Status: AC
  Filled 2022-02-17: qty 2

## 2022-02-17 MED ORDER — BUPIVACAINE LIPOSOME 1.3 % IJ SUSP
INTRAMUSCULAR | Status: DC | PRN
  Administered 2022-02-17: 20 mL

## 2022-02-17 MED ORDER — DEXAMETHASONE SODIUM PHOSPHATE 10 MG/ML IJ SOLN
INTRAMUSCULAR | Status: DC | PRN
  Administered 2022-02-17: 10 mg via INTRAVENOUS

## 2022-02-17 MED ORDER — 0.9 % SODIUM CHLORIDE (POUR BTL) OPTIME
TOPICAL | Status: DC | PRN
  Administered 2022-02-17: 1000 mL

## 2022-02-17 MED ORDER — GLYCOPYRROLATE 0.2 MG/ML IJ SOLN
INTRAMUSCULAR | Status: DC | PRN
  Administered 2022-02-17: .2 mg via INTRAVENOUS

## 2022-02-17 MED ORDER — MIDAZOLAM HCL 2 MG/2ML IJ SOLN
INTRAMUSCULAR | Status: DC | PRN
Start: 2022-02-17 — End: 2022-02-17
  Administered 2022-02-17: 2 mg via INTRAVENOUS

## 2022-02-17 MED ORDER — FAMOTIDINE 20 MG PO TABS: 20.0000 mg | ORAL_TABLET | Freq: Once | ORAL | Status: AC

## 2022-02-17 MED ORDER — ONDANSETRON HCL 4 MG/2ML IJ SOLN
INTRAMUSCULAR | Status: DC | PRN
  Administered 2022-02-17: 4 mg via INTRAVENOUS

## 2022-02-17 SURGICAL SUPPLY — 36 items
BLADE SURG 15 STRL LF DISP TIS (BLADE) ×1 IMPLANT
BLADE SURG 15 STRL SS (BLADE) ×2
BNDG GAUZE DERMACEA FLUFF (GAUZE/BANDAGES/DRESSINGS) ×1
BNDG GAUZE DERMACEA FLUFF 4 (GAUZE/BANDAGES/DRESSINGS) IMPLANT
DRAPE PERI LITHO V/GYN (MISCELLANEOUS) ×2 IMPLANT
DRAPE UNDER BUTTOCK W/FLU (DRAPES) ×2 IMPLANT
DRSG GAUZE FLUFF 36X18 (GAUZE/BANDAGES/DRESSINGS) ×2 IMPLANT
ELECT REM PT RETURN 9FT ADLT (ELECTROSURGICAL) ×2
ELECTRODE REM PT RTRN 9FT ADLT (ELECTROSURGICAL) ×1 IMPLANT
GAUZE 4X4 16PLY ~~LOC~~+RFID DBL (SPONGE) ×2 IMPLANT
GLOVE BIOGEL PI IND STRL 7.0 (GLOVE) ×1 IMPLANT
GLOVE BIOGEL PI INDICATOR 7.0 (GLOVE) ×1
GLOVE SURG SYN 6.5 ES PF (GLOVE) ×2 IMPLANT
GLOVE SURG SYN 6.5 PF PI (GLOVE) ×1 IMPLANT
GOWN STRL REUS W/ TWL LRG LVL3 (GOWN DISPOSABLE) ×2 IMPLANT
GOWN STRL REUS W/TWL LRG LVL3 (GOWN DISPOSABLE) ×4
KIT TURNOVER CYSTO (KITS) ×2 IMPLANT
LABEL OR SOLS (LABEL) ×2 IMPLANT
MANIFOLD NEPTUNE II (INSTRUMENTS) ×2 IMPLANT
NDL HYPO 25X1 1.5 SAFETY (NEEDLE) ×1 IMPLANT
NEEDLE HYPO 22GX1.5 SAFETY (NEEDLE) ×2 IMPLANT
NEEDLE HYPO 25X1 1.5 SAFETY (NEEDLE) ×2 IMPLANT
NS IRRIG 500ML POUR BTL (IV SOLUTION) ×2 IMPLANT
PACK BASIN MINOR ARMC (MISCELLANEOUS) ×2 IMPLANT
PAD PREP 24X41 OB/GYN DISP (PERSONAL CARE ITEMS) ×2 IMPLANT
PANTS MESH DISP 2XL (UNDERPADS AND DIAPERS) ×1 IMPLANT
PANTS MESH DISPOSABLE 2XL (UNDERPADS AND DIAPERS) ×1
SHEARS HARMONIC 9CM CVD (BLADE) IMPLANT
SOL PREP PVP 2OZ (MISCELLANEOUS) ×2
SOLUTION PREP PVP 2OZ (MISCELLANEOUS) ×1 IMPLANT
SURGILUBE 2OZ TUBE FLIPTOP (MISCELLANEOUS) ×2 IMPLANT
SUT VIC AB 3-0 SH 27 (SUTURE) ×2
SUT VIC AB 3-0 SH 27X BRD (SUTURE) ×1 IMPLANT
SWAB CULTURE AMIES ANAERIB BLU (MISCELLANEOUS) IMPLANT
SYR 10ML LL (SYRINGE) ×2 IMPLANT
SYR 20ML LL LF (SYRINGE) ×2 IMPLANT

## 2022-02-17 NOTE — Discharge Instructions (Addendum)
Hemorrhoids, Care After This sheet gives you information about how to care for yourself after your procedure. Your health care provider may also give you more specific instructions. If you have problems or questions, contact your health care provider. What can I expect after the procedure? After the procedure, it is common to have: Soreness. Bruising. Itching.  Follow these instructions at home: site care Follow instructions from your health care provider about how to take care of your site. Make sure you: LEAVE packing in place until it falls out on its own.  No need to replace afterwards Leave stitches (sutures), skin glue, or adhesive strips in place.  If the area bleeds or bruises, apply gentle pressure for 10 minutes. OK TO SHOWER IN 24HRS  General instructions Rest and then return to your normal activities as told by your health care provider. tylenol and advil as needed for discomfort.  Please alternate between the two every four hours as needed for pain.    Use narcotics, if prescribed, only when tylenol and motrin is not enough to control pain.  325-650mg every 8hrs to max of 3000mg/24hrs (including the 325mg in every norco dose) for the tylenol.    Advil up to 800mg per dose every 8hrs as needed for pain.   Keep all follow-up visits as told by your health care provider. This is important. Contact a health care provider if you have excessive: redness, swelling, or pain around your site. blood coming from your site. pus or a bad smell coming from your site. You have a fever.  Get help right away if: You have bleeding that does not stop with pressure or a dressing. Summary After the procedure, it is common to have some soreness, bruising, and itching at the site. Follow instructions from your health care provider about how to take care of your site. Keep all follow-up visits as told by your health care provider. This is important. This information is not intended to replace  advice given to you by your health care provider. Make sure you discuss any questions you have with your health care provider. Document Released: 10/15/2015 Document Revised: 03/18/2018 Document Reviewed: 03/18/2018 Elsevier Interactive Patient Education  2019 [employer]  AMBULATORY SURGERY  DISCHARGE INSTRUCTIONS   The drugs that you were given will stay in your system until tomorrow so for the next 24 hours you should not:  Drive an automobile Make any legal decisions Drink any alcoholic beverage   You may resume regular meals tomorrow.  Today it is better to start with liquids and gradually work up to solid foods.  You may eat anything you prefer, but it is better to start with liquids, then soup and crackers, and gradually work up to solid foods.   Please notify your doctor immediately if you have any unusual bleeding, trouble breathing, redness and pain at the surgery site, drainage, fever, or pain not relieved by medication.    Additional Instructions:     Please contact your physician with any problems or Same Day Surgery at [phone], Monday through Friday 6 am to 4 pm, or [facility] number at [phone].  

## 2022-02-17 NOTE — Interval H&P Note (Signed)
History and Physical Interval Note:  02/17/2022 11:50 AM  Mary Morton  has presented today for surgery, with the diagnosis of K64.5 Thrombosed hemorrhoids / internal hemorrhoids.  The various methods of treatment have been discussed with the patient and family. After consideration of risks, benefits and other options for treatment, the patient has consented to  Procedure(s): HEMORRHOIDECTOMY (N/A) as a surgical intervention.  The patient's history has been reviewed, patient examined, no change in status, stable for surgery.  I have reviewed the patient's chart and labs.  Questions were answered to the patient's satisfaction.     Tyrann Donaho Tonna Boehringer

## 2022-02-17 NOTE — Anesthesia Preprocedure Evaluation (Signed)
Anesthesia Evaluation  Patient identified by MRN, date of birth, ID band Patient awake    Reviewed: Allergy & Precautions, H&P , NPO status , Patient's Chart, lab work & pertinent test results, reviewed documented beta blocker date and time   History of Anesthesia Complications Negative for: history of anesthetic complications  Airway Mallampati: II  TM Distance: >3 FB Neck ROM: full    Dental  (+) Dental Advidsory Given, Caps, Teeth Intact Permanent retainer on bottom:   Pulmonary neg pulmonary ROS,    Pulmonary exam normal breath sounds clear to auscultation       Cardiovascular Exercise Tolerance: Good negative cardio ROS Normal cardiovascular exam Rhythm:regular Rate:Normal     Neuro/Psych negative neurological ROS  negative psych ROS   GI/Hepatic negative GI ROS, Neg liver ROS,   Endo/Other  negative endocrine ROS  Renal/GU negative Renal ROS  negative genitourinary   Musculoskeletal   Abdominal   Peds  Hematology negative hematology ROS (+)   Anesthesia Other Findings Past Medical History: No date: Anemia No date: GERD (gastroesophageal reflux disease)     Comment:  with pregnancy Obesity  Reproductive/Obstetrics negative OB ROS                             Anesthesia Physical Anesthesia Plan  ASA: 2  Anesthesia Plan: General   Post-op Pain Management:    Induction: Intravenous  PONV Risk Score and Plan: 3 and Ondansetron, Dexamethasone, Midazolam and Treatment may vary due to age or medical condition  Airway Management Planned: LMA and Oral ETT  Additional Equipment:   Intra-op Plan:   Post-operative Plan: Extubation in OR  Informed Consent: I have reviewed the patients History and Physical, chart, labs and discussed the procedure including the risks, benefits and alternatives for the proposed anesthesia with the patient or authorized representative who has  indicated his/her understanding and acceptance.     Dental Advisory Given  Plan Discussed with: Anesthesiologist, CRNA and Surgeon  Anesthesia Plan Comments:         Anesthesia Quick Evaluation

## 2022-02-17 NOTE — Transfer of Care (Signed)
Immediate Anesthesia Transfer of Care Note  Patient: Mary Morton  Procedure(s) Performed: HEMORRHOIDECTOMY (Rectum)  Patient Location: PACU  Anesthesia Type:General  Level of Consciousness: sedated  Airway & Oxygen Therapy: Patient Spontanous Breathing and Patient connected to face mask oxygen  Post-op Assessment: Report given to RN and Post -op Vital signs reviewed and stable  Post vital signs: Reviewed and stable  Last Vitals:  Vitals Value Taken Time  BP 95/52 02/17/22 1237  Temp 36.1 C 02/17/22 1237  Pulse 66 02/17/22 1244  Resp 15 02/17/22 1244  SpO2 100 % 02/17/22 1244  Vitals shown include unvalidated device data.  Last Pain:  Vitals:   02/17/22 1039  TempSrc: Temporal  PainSc: 0-No pain         Complications: No notable events documented.

## 2022-02-17 NOTE — Anesthesia Procedure Notes (Signed)
Procedure Name: LMA Insertion Date/Time: 02/17/2022 12:03 PM Performed by: Demetrius Charity, CRNA Pre-anesthesia Checklist: Patient identified, Patient being monitored, Timeout performed, Emergency Drugs available and Suction available Patient Re-evaluated:Patient Re-evaluated prior to induction Oxygen Delivery Method: Circle system utilized Preoxygenation: Pre-oxygenation with 100% oxygen Induction Type: IV induction Ventilation: Mask ventilation without difficulty LMA: LMA inserted LMA Size: 3.5 Tube type: Oral Number of attempts: 1 Placement Confirmation: positive ETCO2 and breath sounds checked- equal and bilateral Tube secured with: Tape Dental Injury: Teeth and Oropharynx as per pre-operative assessment

## 2022-02-17 NOTE — Op Note (Signed)
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  Surgeon: Tonna Boehringer  Anesthesia: general  Specimen: hemorrhoids x1  Complications: none  EBL: 73mL  Wound classification: Clean Contaminated  Indications: Patient is a 41 y.o. female was found to have symptomatic hemorrhoids refractory to medical management.   Findings: 1. 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 Internal and external anal sphincter palpated and preserved 3. Adequate hemostasis  Description of procedure: The patient was brought to the operating room and general anesthesia was induced. Patient was placed high lithotomy position. A time-out was completed verifying correct patient, procedure, site, positioning, and implant(s) and/or special equipment prior to beginning this procedure. The perineum was prepped and draped in standard sterile fashion. Local anesthetic was injected as a perianal block. An anoscope was introduced and hemorrhoidal pedicles with associated perianal thrombosis.  A harmonic was placed across the base of the left lateral pedicle and excess hemorrhoidal tissue removed.  Specimen was passed off operative field pending pathology.  3-0 Vicryl then used to close the open wound.   Last inspection of the anal canal did not note any additional hemorrhoidal tissue and no other pathology. Exparel injected as a perianal block. A gauze pad was tucked between the gluteal folds, and secured in place with mesh underwear.  The patient tolerated the procedure well and was taken to the postanesthesia care unit in stable condition.  Sponge and instrument count correct at end of procedure.

## 2022-02-18 NOTE — Anesthesia Postprocedure Evaluation (Signed)
Anesthesia Post Note  Patient: Mary Morton  Procedure(s) Performed: HEMORRHOIDECTOMY (Rectum)  Patient location during evaluation: PACU Anesthesia Type: General Level of consciousness: awake and alert Pain management: pain level controlled Vital Signs Assessment: post-procedure vital signs reviewed and stable Respiratory status: spontaneous breathing, nonlabored ventilation, respiratory function stable and patient connected to nasal cannula oxygen Cardiovascular status: blood pressure returned to baseline and stable Postop Assessment: no apparent nausea or vomiting Anesthetic complications: no   No notable events documented.   Last Vitals:  Vitals:   02/17/22 1315 02/17/22 1329  BP: 120/79 140/76  Pulse: 76 69  Resp: 19 16  Temp:  (!) 36.1 C  SpO2: 100% 100%    Last Pain:  Vitals:   02/17/22 1329  TempSrc: Temporal  PainSc: 0-No pain                 Lenard Simmer

## 2022-02-20 LAB — SURGICAL PATHOLOGY

## 2023-09-12 IMAGING — MG MM BREAST LOCALIZATION CLIP
4 series · 4 of 12 positions shown · non-contrast
Comparison: Previous exam(s).

CLINICAL DATA: Status post stereo biopsy left breast 2 sites

EXAM:
3D DIAGNOSTIC LEFT MAMMOGRAM POST STEREOTACTIC BIOPSY

[L CC synth-2D]
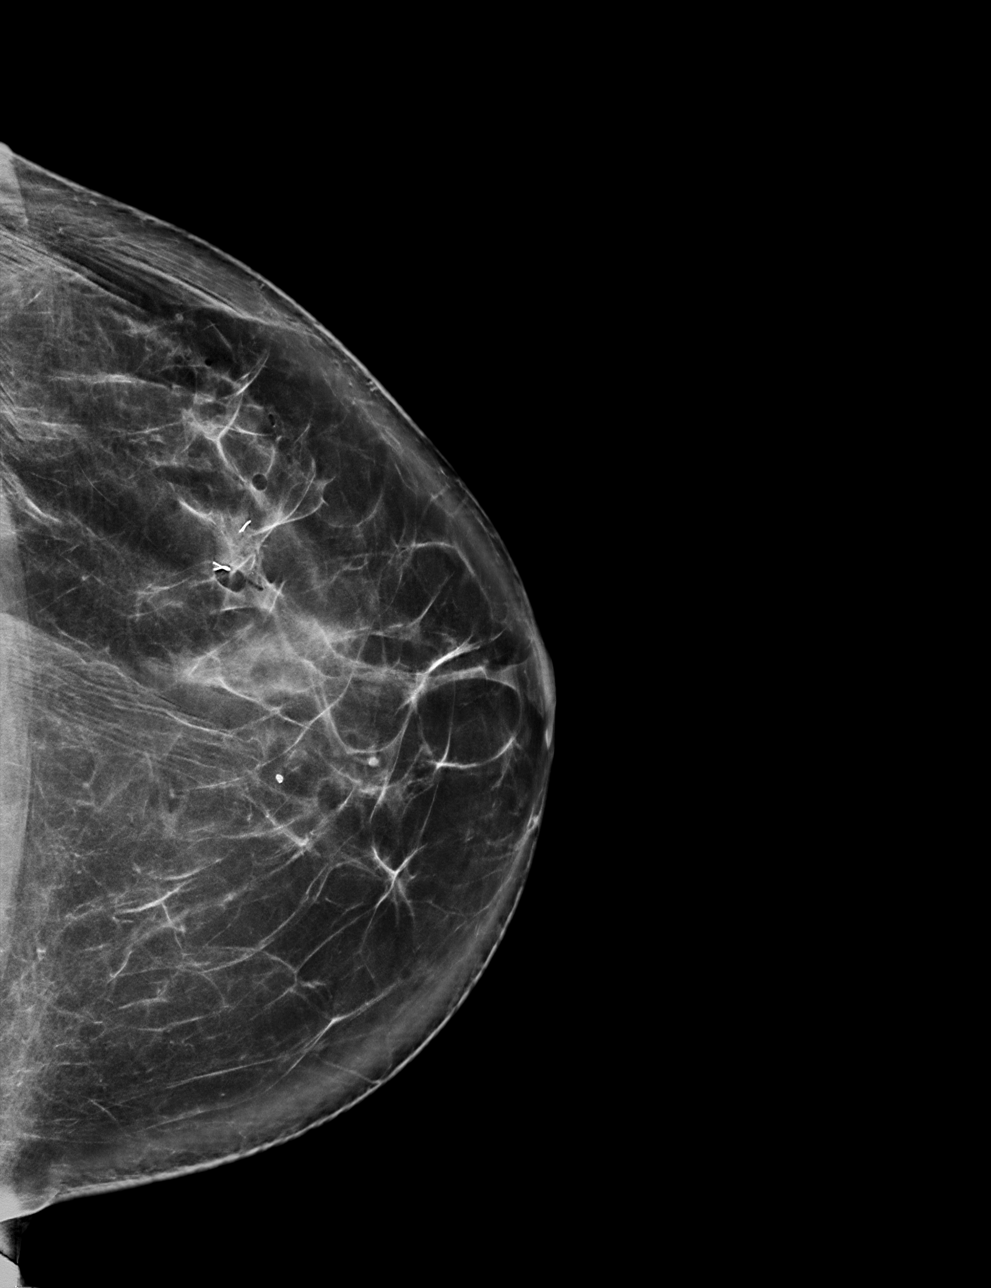

[L LM synth-2D]
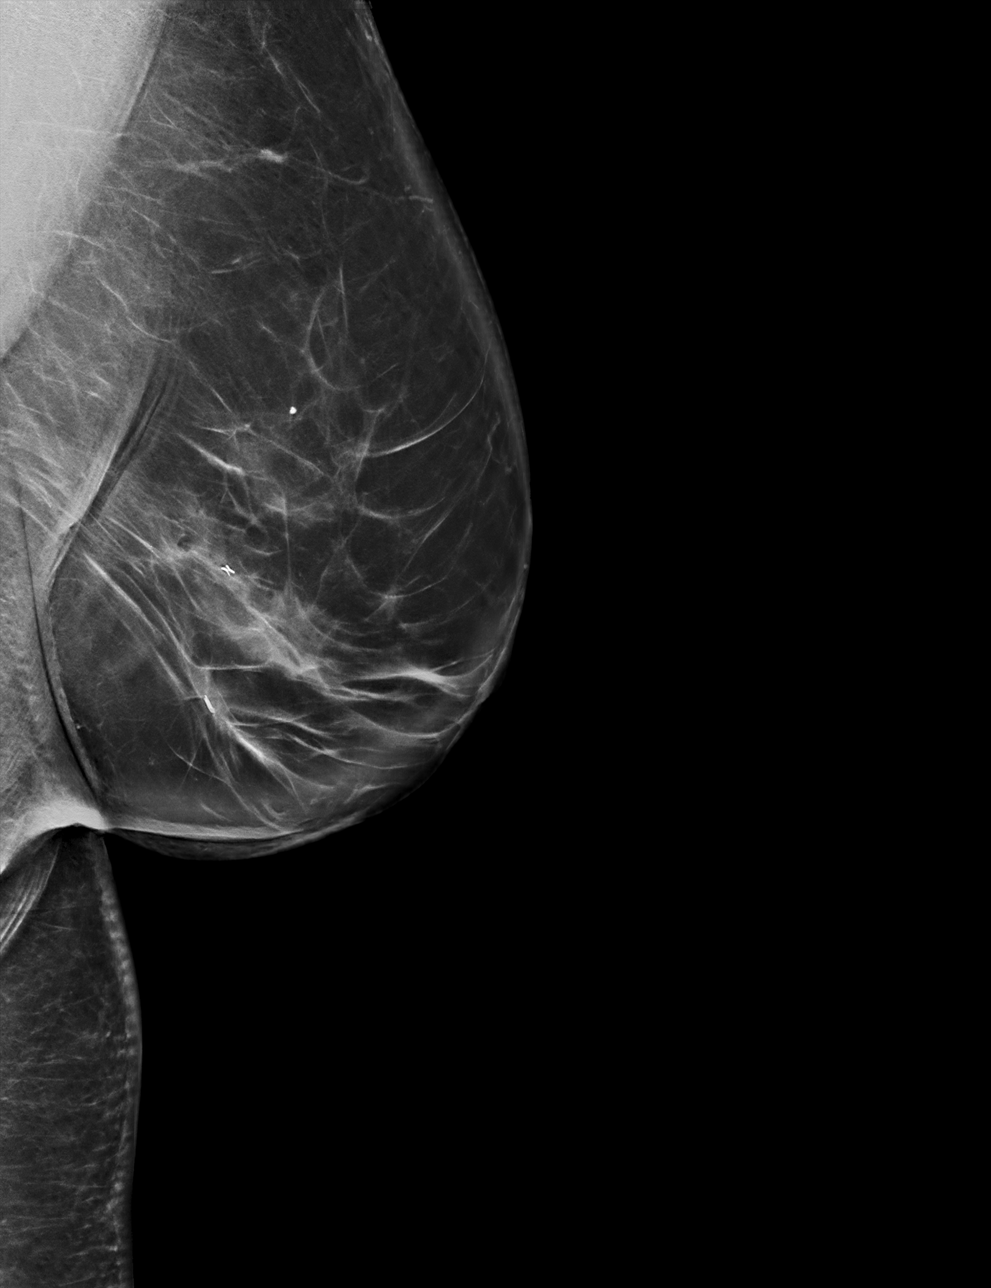

[L CC tomo · tomo slice 40/79.0]
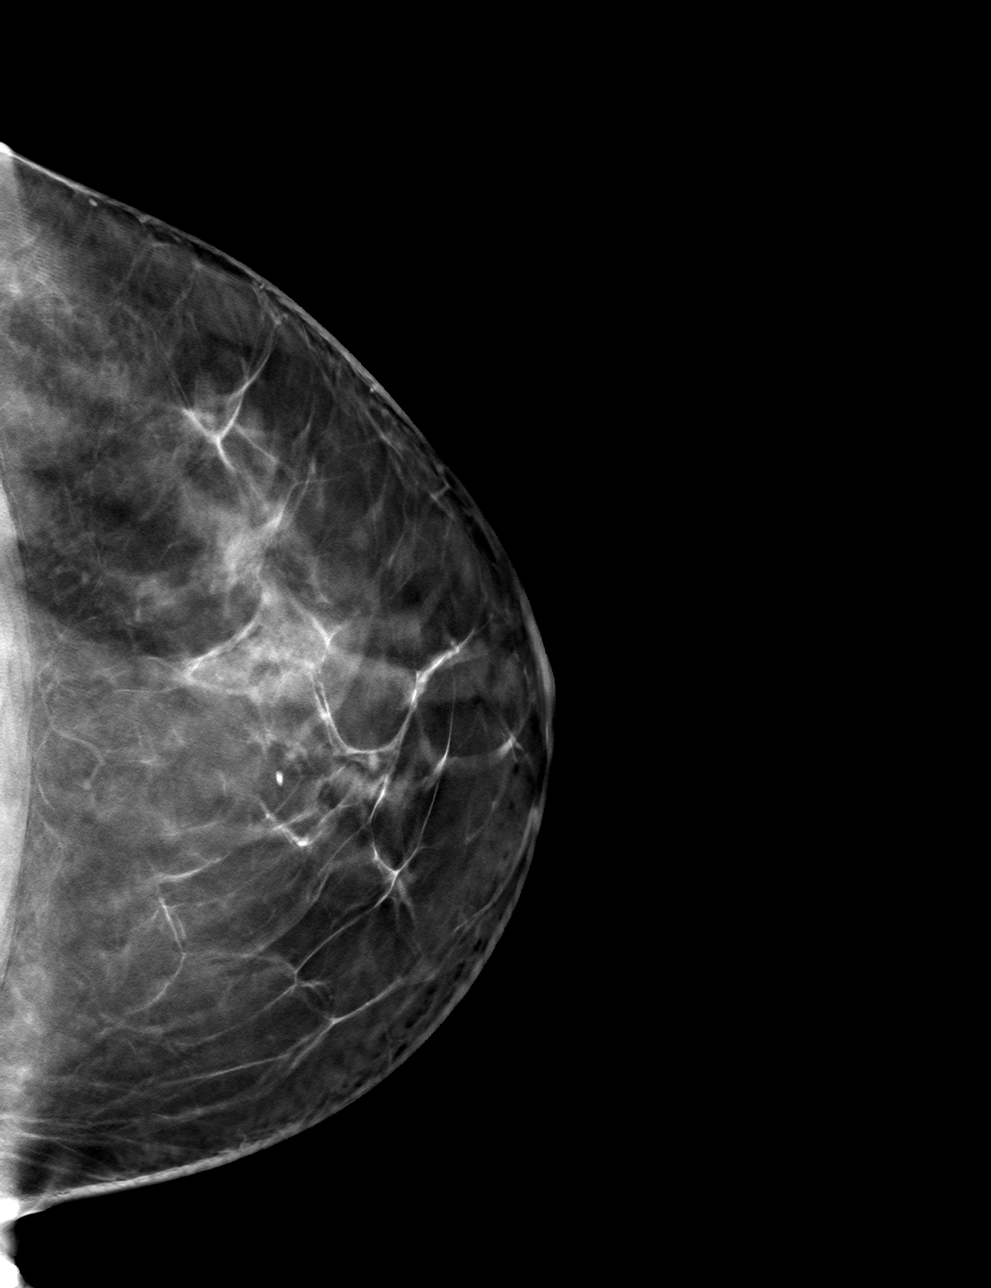

[L LM tomo · tomo slice 49/97.0]
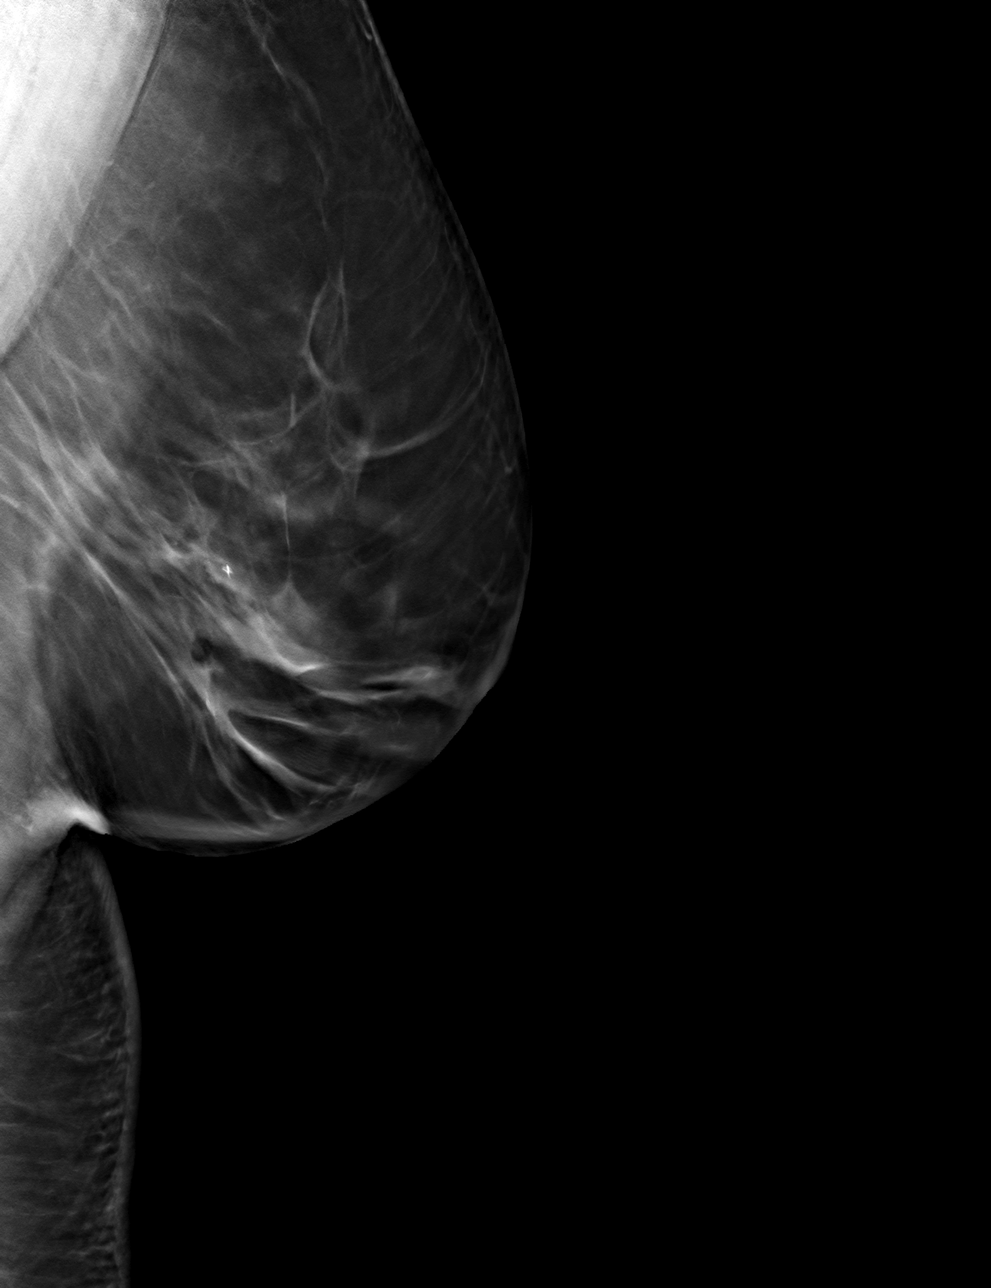

[4 of 12 positions shown; findings below may reference images not displayed]

FINDINGS: 3D Mammographic images were obtained following stereotactic guided
biopsy of left breast 2 sites.

Site 1: Upper-outer left breast: X clip: In appropriate position.

Site 2: Lower outer left breast: Ribbon clip: In appropriate
position.
IMPRESSION: Appropriate positioning of the biopsy marking clips as above.

Final Assessment: Post Procedure Mammograms for Marker Placement
